# Patient Record
Sex: Male | Born: 1965 | Race: White | Hispanic: No | Smoking: Former smoker
Health system: Southern US, Community
[De-identification: ages and names within clinical notes are randomized; demographics above are authoritative.]

## PROBLEM LIST (undated history)

## (undated) DIAGNOSIS — K219 Gastro-esophageal reflux disease without esophagitis: Secondary | ICD-10-CM

## (undated) DIAGNOSIS — I10 Essential (primary) hypertension: Secondary | ICD-10-CM

## (undated) DIAGNOSIS — M519 Unspecified thoracic, thoracolumbar and lumbosacral intervertebral disc disorder: Secondary | ICD-10-CM

## (undated) DIAGNOSIS — G4733 Obstructive sleep apnea (adult) (pediatric): Secondary | ICD-10-CM

## (undated) DIAGNOSIS — D689 Coagulation defect, unspecified: Secondary | ICD-10-CM

## (undated) DIAGNOSIS — A4902 Methicillin resistant Staphylococcus aureus infection, unspecified site: Secondary | ICD-10-CM

## (undated) DIAGNOSIS — I2699 Other pulmonary embolism without acute cor pulmonale: Secondary | ICD-10-CM

## (undated) HISTORY — DX: Morbid (severe) obesity due to excess calories: E66.01

## (undated) HISTORY — DX: Unspecified thoracic, thoracolumbar and lumbosacral intervertebral disc disorder: M51.9

## (undated) HISTORY — DX: Obstructive sleep apnea (adult) (pediatric): G47.33

## (undated) HISTORY — DX: Gastro-esophageal reflux disease without esophagitis: K21.9

## (undated) HISTORY — DX: Coagulation defect, unspecified: D68.9

## (undated) HISTORY — DX: Other pulmonary embolism without acute cor pulmonale: I26.99

## (undated) HISTORY — DX: Methicillin resistant Staphylococcus aureus infection, unspecified site: A49.02

## (undated) HISTORY — DX: Essential (primary) hypertension: I10

---

## 2002-12-19 ENCOUNTER — Ambulatory Visit (HOSPITAL_COMMUNITY): Admission: RE | Admit: 2002-12-19 | Discharge: 2002-12-19 | Payer: Self-pay | Admitting: Gastroenterology

## 2003-10-21 DIAGNOSIS — A4902 Methicillin resistant Staphylococcus aureus infection, unspecified site: Secondary | ICD-10-CM

## 2003-10-21 HISTORY — DX: Methicillin resistant Staphylococcus aureus infection, unspecified site: A49.02

## 2004-09-01 ENCOUNTER — Inpatient Hospital Stay (HOSPITAL_COMMUNITY): Admission: EM | Admit: 2004-09-01 | Discharge: 2004-09-04 | Payer: Self-pay | Admitting: *Deleted

## 2004-10-06 ENCOUNTER — Ambulatory Visit (HOSPITAL_BASED_OUTPATIENT_CLINIC_OR_DEPARTMENT_OTHER): Admission: RE | Admit: 2004-10-06 | Discharge: 2004-10-06 | Payer: Self-pay | Admitting: Otolaryngology

## 2006-10-20 DIAGNOSIS — I2699 Other pulmonary embolism without acute cor pulmonale: Secondary | ICD-10-CM

## 2006-10-20 HISTORY — PX: VENA CAVA FILTER PLACEMENT: SUR1032

## 2006-10-20 HISTORY — DX: Other pulmonary embolism without acute cor pulmonale: I26.99

## 2007-03-01 ENCOUNTER — Inpatient Hospital Stay (HOSPITAL_COMMUNITY): Admission: AD | Admit: 2007-03-01 | Discharge: 2007-03-05 | Payer: Self-pay | Admitting: Internal Medicine

## 2007-03-01 ENCOUNTER — Encounter: Payer: Self-pay | Admitting: Emergency Medicine

## 2007-03-01 ENCOUNTER — Encounter: Payer: Self-pay | Admitting: Vascular Surgery

## 2007-03-10 ENCOUNTER — Inpatient Hospital Stay (HOSPITAL_COMMUNITY): Admission: EM | Admit: 2007-03-10 | Discharge: 2007-03-21 | Payer: Self-pay | Admitting: Emergency Medicine

## 2007-03-10 ENCOUNTER — Ambulatory Visit: Payer: Self-pay | Admitting: Vascular Surgery

## 2007-03-10 ENCOUNTER — Encounter (INDEPENDENT_AMBULATORY_CARE_PROVIDER_SITE_OTHER): Payer: Self-pay | Admitting: Internal Medicine

## 2007-03-12 ENCOUNTER — Ambulatory Visit: Payer: Self-pay | Admitting: Oncology

## 2008-06-27 IMAGING — CT CT ANGIO CHEST
3 of 4 series · 19 of 36 positions shown · IV contrast (APPLIED)
Comparison: none

CLINICAL DATA: Chest pain.  Reportedly diagnosed with DVT/PE last week and has been on heparin and Lovenox, is now on Coumadin.
CT ANGIOGRAPHY OF CHEST:
TECHNIQUE: Multidetector CT imaging of the chest was performed during bolus injection of intravenous contrast.  Multiplanar CT angiographic image reconstructions were generated to evaluate the vascular anatomy.
Contrast:  100 cc Omnipaque 350.

[Series 4: pulm embolism 2.0 b31f st · axial · 0.76mm/px · z∈[+994,+1256]mm · 13 of 153 slices shown]
[im 11/153  lung]
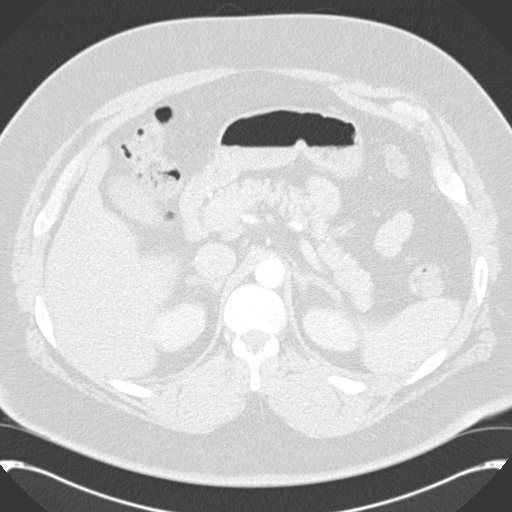
[im 22/153  mediastinal]
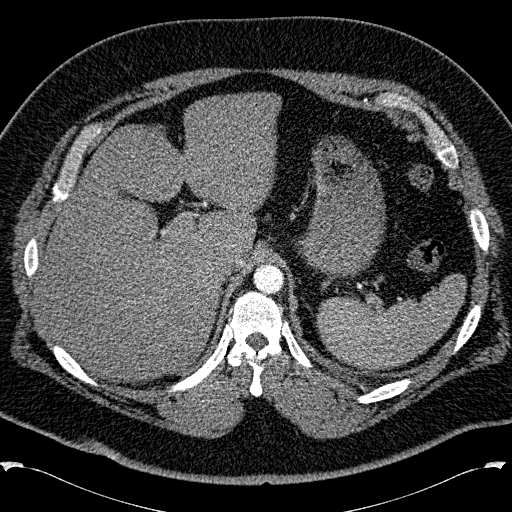
[im 33/153  lung]
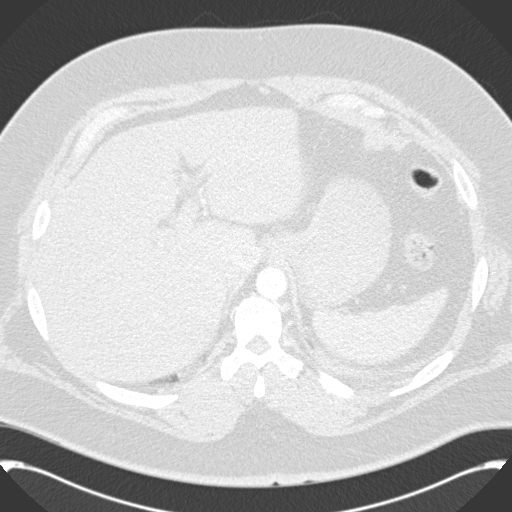
[im 44/153  mediastinal]
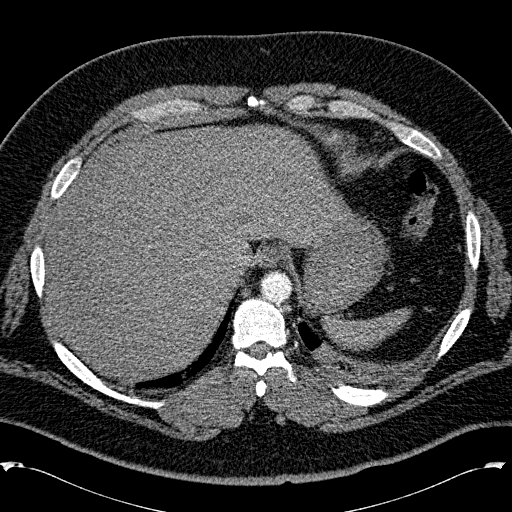
[im 55/153  lung]
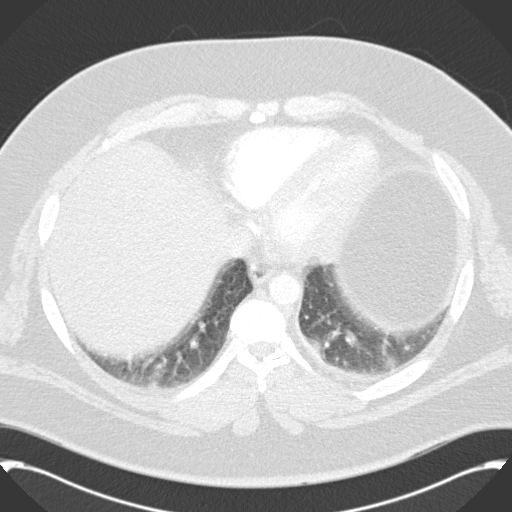
[im 66/153  mediastinal]
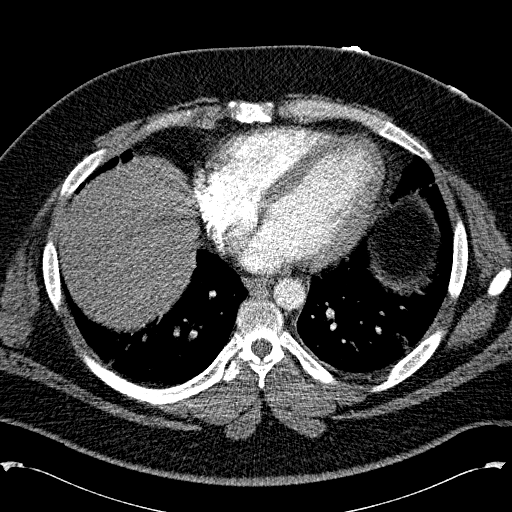
[im 77/153  lung]
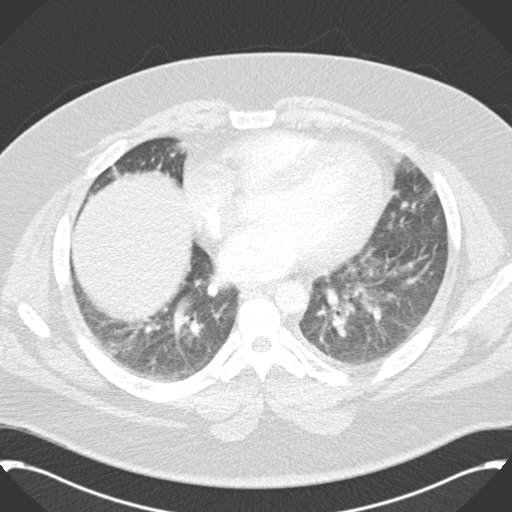
[im 87/153  mediastinal]
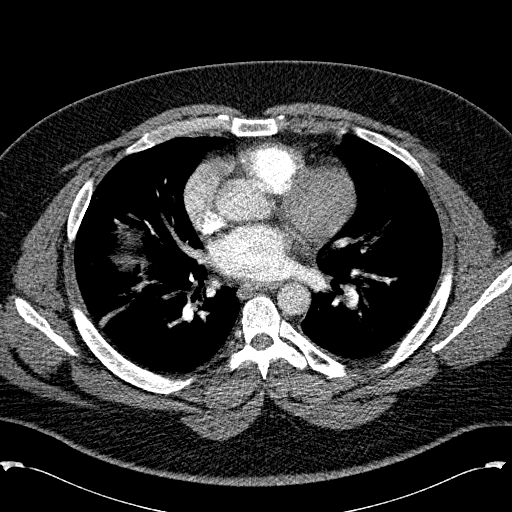
[im 98/153  lung]
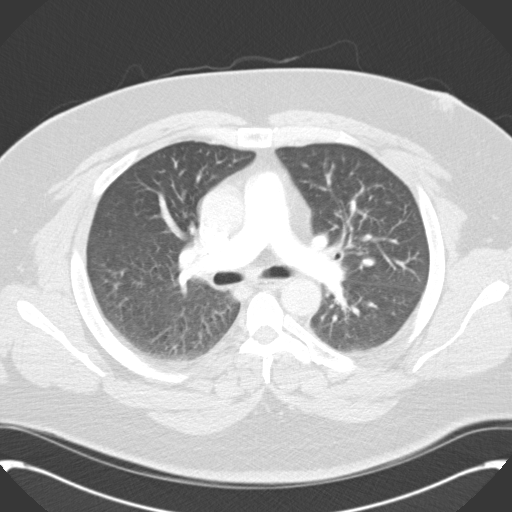
[im 109/153  mediastinal]
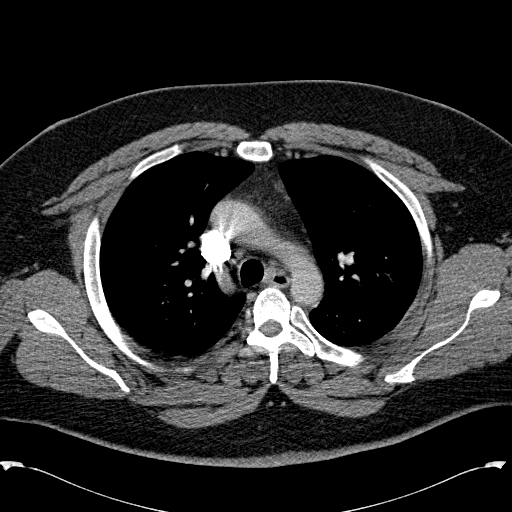
[im 120/153  lung]
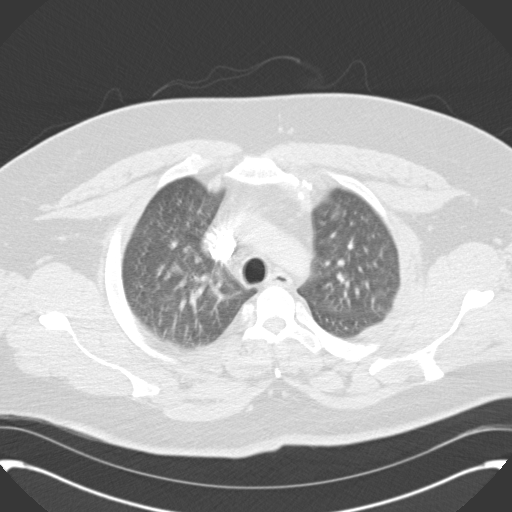
[im 131/153  mediastinal]
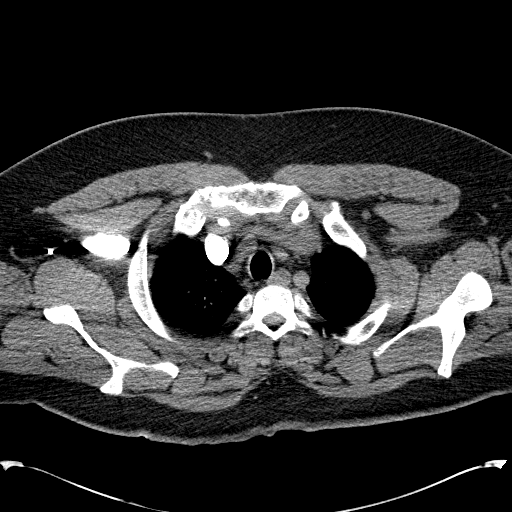
[im 142/153  lung]
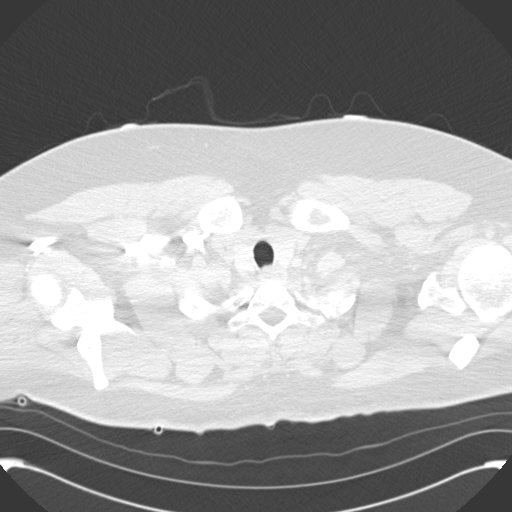

[Series 5: pulm embolism 2.0 b60f lung · axial · 0.76mm/px · z∈[+1024,+1094]mm · 3 of 139 slices shown]
[im 12/139  mediastinal]
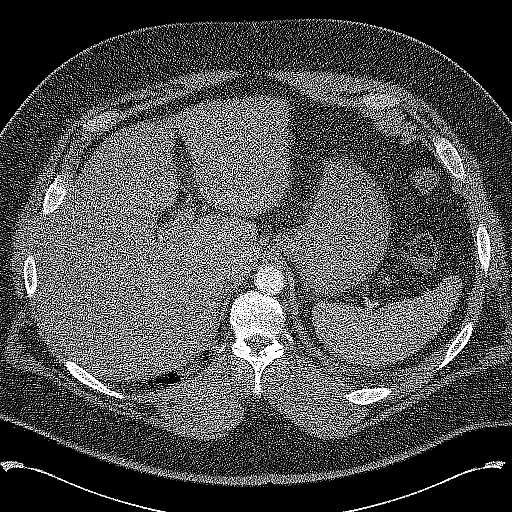
[im 35/139  mediastinal]
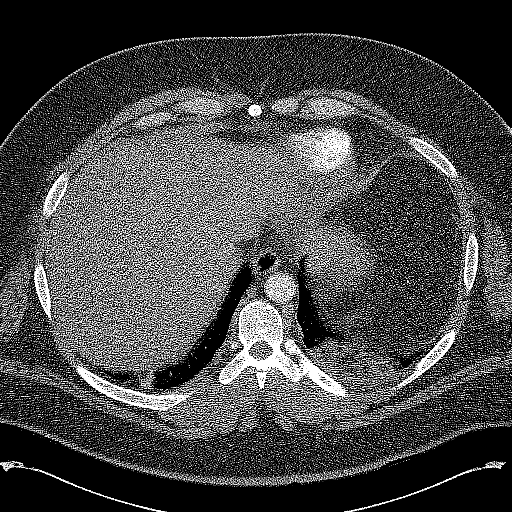
[im 47/139  mediastinal]
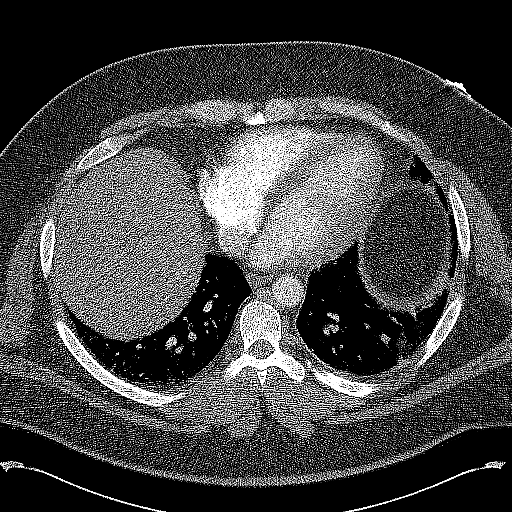

[Series 603: cor chest · coronal · 0.76mm/px · 3 of 116 slices shown]
[im 24/116  mediastinal]
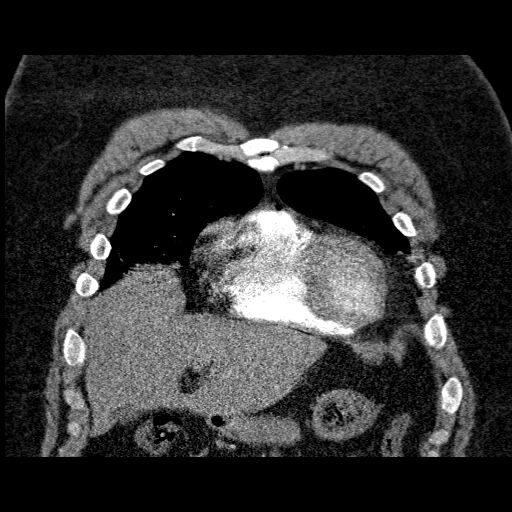
[im 47/116  mediastinal]
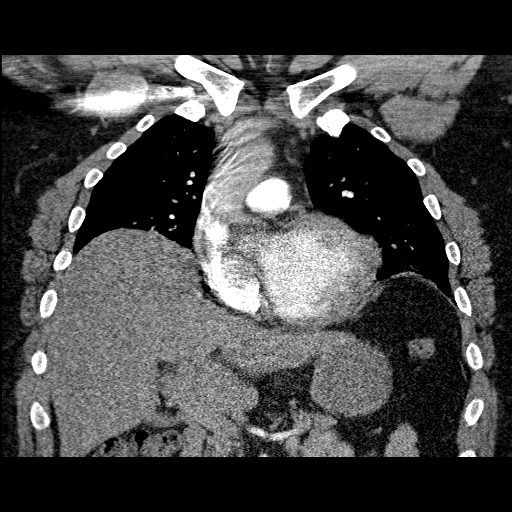
[im 70/116  mediastinal]
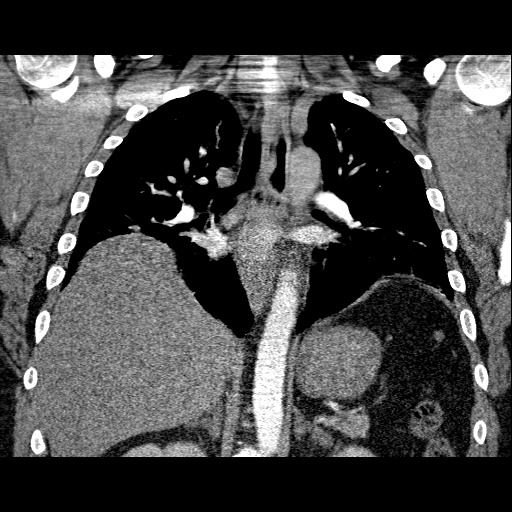

[19 of 36 positions shown; findings below may reference images not displayed]

FINDINGS: Again appreciated is the embolus involving the left lower lobar pulmonary artery branches and mural thrombus at the interlobar portion of the left pulmonary artery right where it bifurcates into left upper and lower lobar pulmonary arterial branches.  This site also appears unchanged.  Prior exam revealed embolus in the right lower lobe pulmonary artery branches.  There appears to be new embolus in the proximal descending right PA and its branches.  Subsegmental atelectasis at the lung bases.  Some improvement in posterior wedge-shaped consolidation in the RLL.  Overall there has been some improvement in aeration of the lungs.
IMPRESSION: Bilateral pulmonary emboli are again appreciated.  There appear to be some new emboli to the right lower lobe.  Overall, no significant change in bilateral pulmonary emboli when compared to 03/01/07.

## 2008-06-27 IMAGING — CR DG CHEST 2V
2 series · 2 of 2 positions shown · non-contrast
Comparison: 03/01/2007

CLINICAL DATA: Chest pain and shortness of breath.

CHEST - 2 VIEW

[w chest pa]
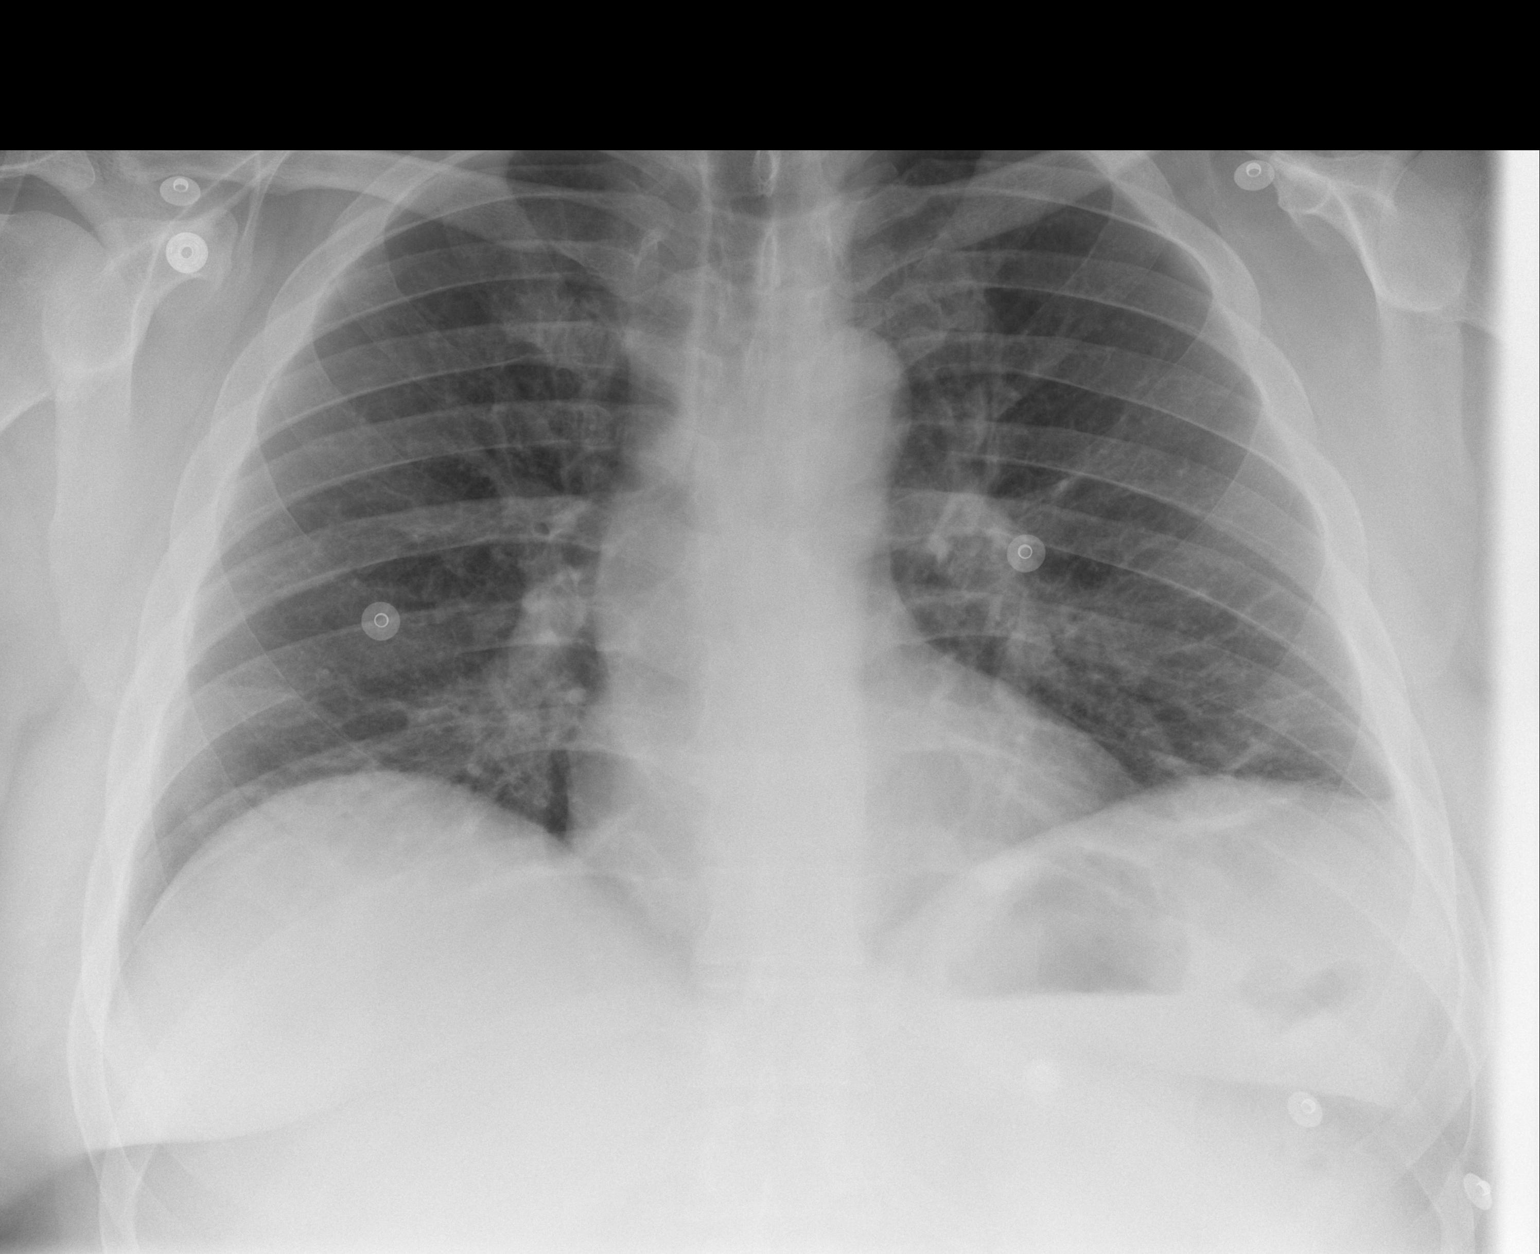

[w chest lat]
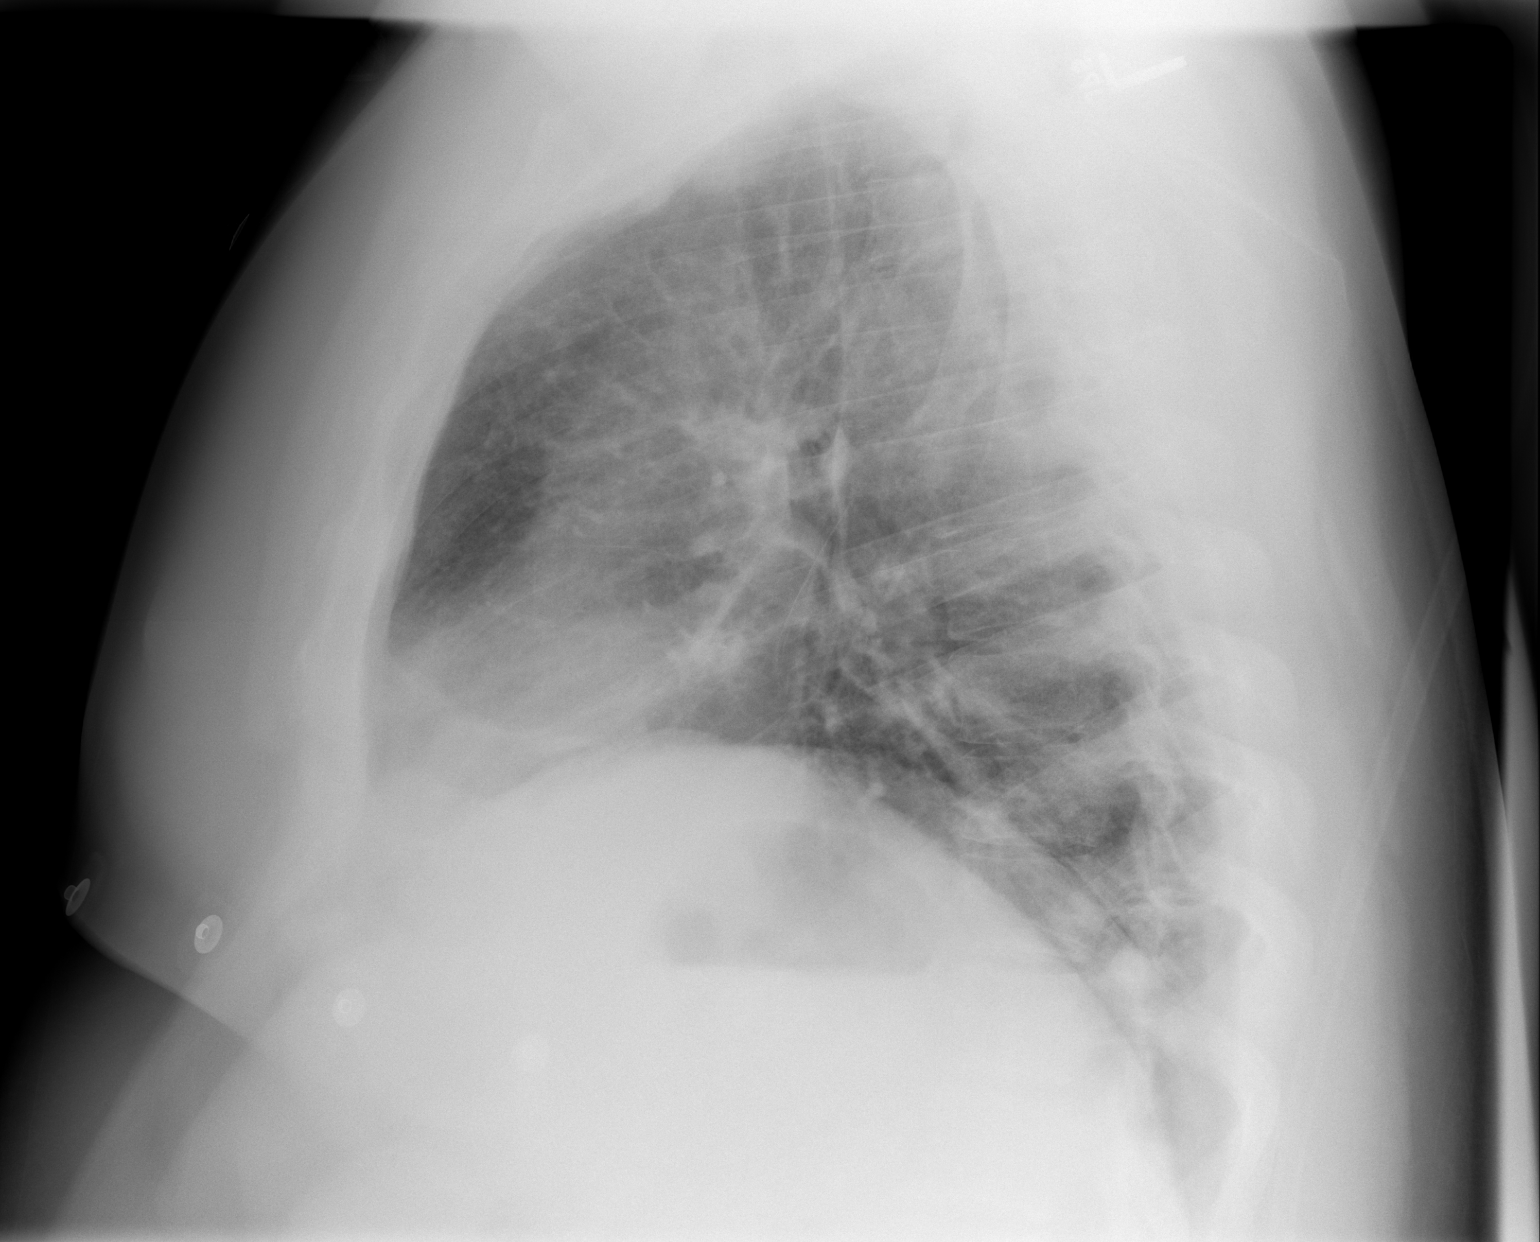

[2 of 2 positions shown; findings below may reference images not displayed]

FINDINGS: Bibasilar linear opacities are compatible with subsegmental
atelectasis or scar. Mildly low lung volumes are present. No definite pleural
effusion noted. Heart size remains within normal limits.

IMPRESSION

1. Stable appearance of bibasilar atelectasis or scarring. Please note that the
patient was recently diagnosed with acute pulmonary embolus on prior CT scan of
03/01/2007.

## 2011-03-04 NOTE — Consult Note (Signed)
[name], [name]              ACCOUNT NO.:  [id number]   MEDICAL RECORD NO.:  [id number]          PATIENT TYPE:  INP   LOCATION:  3731                         FACILITY:  [facility]   PHYSICIAN:  Lennis P. 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  He has  a positive family history of clots in his father who has prostate  cancer, and his mother who has intracardiac thrombus following an MI,  and a sister with a primary brain cancer and uterine cancer.  He has 4  other siblings who are healthy, no known coagulopathy in those.  The  patient had a persistent cramp in his right calf for several weeks in  mid April, then was admitted from May 12 to May 15 with bilateral  pulmonary emboli.  He was discharged on full dose b.i.d. Lovenox with  Coumadin continuing, the Coumadin not therapeutic at the time of  discharge on May 16.  He had INR checked by [facility]  on May 19, which the patient reports was 3.5, Lovenox discontinued then  and his Coumadin dose decreased to 10 mg daily from 12.5 mg.  He had  recurrent chest pain and shortness of breath and was readmitted on May  21 with findings of new pulmonary emboli, INR 3.1 at that point.  He has  had an IVC filter placed earlier today. He is no longer having any chest  pain or shortness of breath with minimal activity in the room prior to  IVC placement today.  Labs drawn May 12 before anticoagulation factor V  Leiden negative and a thrombin 374  (70 to 120), total protein C75 (70 to  140), Functional protein C145 (91 to 147), total protein S106 (70 to  140), functional protein S109 (81 to 180), and also lupus anticoagulant  drawn May 21, which was negative.  Repeat levels of the protein C and  protein S are low this admission, which is consistent with the Coumadin  use.   A CT of his chest, other than the pulmonary emboli, has had no  significant findings since CT abdomen and pelvis done Mar 11, 2007.  No  findings of concern.  Other labs from May 21, white count 9.1,  hemoglobin 15, platelets 327, sodium 135, potassium 4.3, chloride 101,  CO2 28, glucose 103, BUN 21, creatinine 1, alkaline phosphatase 52, GOT  21, GPT 60, total protein 7.8, albumin 3.5, calcium 9.3.   ALLERGIES:  NO KNOWN DRUG ALLERGIES.   PAST MEDICAL HISTORY:  1. He has hypertensive urgency, last admission.  2. GERD.  3. Sleep apnea.  4. Morbid obesity.  PRIMARY CARE PHYSICIAN:  [facility].   IMAGING:  Lower extremity Doppler's last admission and this admission  negative.   FAMILY HISTORY:  As above.   SOCIAL HISTORY:  Divorced, lives with his girlfriend.  Past cigarettes.  Recent cigars, which were discontinued on the day of the last admission  Mar 01, 2007.  With his work he has had recent plane travel and  frequently has 4+ hour drives.   PHYSICAL EXAMINATION:  GENERAL:  He is a pleasant gentleman, appears  comfortable, supine, on room air.  Girlfriend is present and very  supportive.  VITAL SIGNS:  Temperature 98, blood pressure 154/80, heart rate 89 and  regular, respirations 18 and not labored, 92% saturated on room air.  Weight on Mar 10, 2007, 153 kilograms.  NECK:  He has some bleeding on the dressing on his right neck from the  IVC filter placement.  LUNGS:  Have breath sounds heard bilaterally.  No wheezes.  HEART:  Regular rate and rhythm without gallop.  ABDOMEN:  Soft, obese, nontender, quite.  LOWER  EXTREMITIES:  No pitting edema, cords or tenderness, feet are warm  bilaterally.   IMPRESSION AND PLAN:  1. Recurrent pulmonary emboli now with IVC filter placed:  He does not      appear to have a protein C deficiency by labs drawn prior to any      anticoagulation.  There is no other clear etiology from these labs,      though certainly he has significant lifestyle risk factors.  In the      family members with DVTs, there is history of comorbid conditions      that may have caused the clots, so again I am not certain that this      is truly a family history.  I would be in favor of continuing      Coumadin at this point, keeping his INR from 2.5 to 3.5 with close      monitoring after discharge (by primary physician would be fine or      please let me know if this needs to be done at my office), as well      as eliminating lifestyle risk factors.  He has already stopped      smoking and I have encouraged him to continue with this.  He      clearly needs to increase activity, loose weight to ideal, and no      prolonged sitting.   Please call if my service can be of help from here.      Lennis P. Darrold Span, M.D.  Electronically Signed     LPL/MEDQ  D:  03/12/2007  T:  03/13/2007  Job:  [id number]

## 2011-03-04 NOTE — Discharge Summary (Signed)
[name], [name]              ACCOUNT NO.:  [id number]   MEDICAL RECORD NO.:  [id number]          PATIENT TYPE:  INP   LOCATION:  5024                         FACILITY:  [facility]   PHYSICIAN:  Wilson Singer, M.D.DATE OF BIRTH:  06-20-1966   DATE OF ADMISSION:  03/10/2007  DATE OF DISCHARGE:                               DISCHARGE SUMMARY   FINAL DISCHARGE DIAGNOSES:  1. Recurring pulmonary emboli.  2. Status post insertion of inferior vena cava Greenfield filter on      Mar 12, 2007, complicated by local bleeding, which has now stopped.  3. Morbid obesity.  4. Hypertension.   MEDICATIONS:  At present:  1. Protonix 40 mg daily.  2. Coumadin dependent on prothrombin time.  3. Hydrozine 10 mg t.i.d.  4. Prinivil 40 mg daily.  5. HCTZ 25 mg daily.  6. Lovenox 150 mg q.12 hourly per protocol.   CONDITION AT THE PRESENT:  Stable.   HISTORY:  This 45 year old man was admitted with left-side pleuritic  chest pain and a CT angio chest infractured new pulmonary emboli on the  right lower lobe.  In addition to the previous bilateral pulmonary  emboli for which he was admitted more recently a few weeks ago.  Please  see initial history and physical examination done by myself.   HOSPITAL PROGRESS:  He was admitted and anticoagulated with Lovenox and  Coumadin.  I discussed his case with the interventional radiologist to  agree that he needed a Greenfield filter and an IVC filter was therefore  placed on Mar 12, 2007.  The procedure apparently went without  complications, but then following the procedure he continued to have  local bleeding at the site of the right internal jugular stick.  The  bleeding continued for over 24 hours and was somewhat difficult to stop  by even applying local pressure and sitting up the patient in bed.  Eventually he did require fresh frozen plasma and a total of 10 mg of  vitamin K intravenously.  He did not require any blood transfusions.  The  bleeding did stop 2 days ago.  He had minimal bleeding yesterday and  today there has not been any bleeding.  He feels well.  There is no  shortness of breath.  He is now being anticoagulated once again with  Lovenox and Coumadin  per the pharmacy.   PHYSICAL EXAMINATION:  VITAL SIGNS:  Today temperature 98.3, blood  pressure 115/66, pulse 87, saturation 97% on room air.  GENERAL:  There are no new physical findings.  LUNGS:  His lung fields are clear.   INVESTIGATIONS:  Today shows an INR of 1.1.   FURTHER DISPOSITION:  Clearly because of the vitamin K it was necessary  to reverse the effects of Coumadin while he was bleeding status post IVC  filter.  It will take longer to anti-coagulate him fully again and he  understands  this.  In the meantime he is on Lovenox subcutaneously also for  anticoagulation.  Dr. Darrold Span, the hematologist, also has seen the  patient and agrees with management and she feels the goal  INR should be  2.5 to 3.5, based on his history of recurrent pulmonary emboli.      Wilson Singer, M.D.  Electronically Signed     NCG/MEDQ  D:  03/16/2007  T:  03/16/2007  Job:  [id number]   cc:   [facility]

## 2011-03-04 NOTE — H&P (Signed)
NAMEMARRION, [name]              ACCOUNT NO.:  [id number]   MEDICAL RECORD NO.:  [id number]          PATIENT TYPE:  INP   LOCATION:  3731                         FACILITY:  [facility]   PHYSICIAN:  Wilson Singer, M.D.DATE OF BIRTH:  11/26/1965   DATE OF ADMISSION:  03/10/2007  DATE OF DISCHARGE:                              HISTORY & PHYSICAL   HISTORY:  This is a 45 year old man who was just discharged  approximately a week ago, having been admitted with bilateral pulmonary  emboli. He was admitted, anticoagulated and did well and was  transitioned from Lovenox onto Coumadin, which he started about 3 days  ago.  He had been doing well, but in the last 24 hours, he began to get  left pleuritic chest pain and represented himself to the emergency room  where a concern about further pulmonary emboli.  When he was seen in the  emergency room, a CT angio of chest scan was done and interestingly,  this showed new pulmonary emboli in the left lower lobe, in addition to  the previous bilateral pulmonary emboli already seen.  He has a  background history of hypertension and obesity.   SOCIAL HISTORY:  He is single, but lives with his girlfriend.  He smokes  about 3 cigars per day.  He occasionally drinks alcohol.  He does not  have consistent employment at the present time.   PAST SURGICAL HISTORY:  Adenoidectomy in his first grade.   PAST MEDICAL HISTORY:  1. Hypertension.  2. Bilateral pulmonary emboli, as mentioned above.  3. History of sleep apnea.  4. Morbid obesity.   MEDICATIONS:  1. Coumadin 10 mg daily.  2. Hydralazine 10 mg t.i.d.  3. Lisinopril 40 mg daily.  4. HCTZ 25 mg daily.  5. Ibuprofen p.r.n.  6. Zantac p.r.n.   ALLERGIES:  NONE.   FAMILY HISTORY:  Noncontributory.   REVIEW OF SYSTEMS:  He has no other symptoms referable to all systems  examined and in particular does not have hemoptysis, shortness of  breath, dizziness or syncope.   PHYSICAL EXAMINATION:   GENERAL:  He is morbidly obese.  VITAL SIGNS:  Temperature 97.0.  Blood pressure 128/83.  Pulse 82.  Respiratory rate 12.  Saturation 96% on room air.  CARDIOVASCULAR EXAMINATION:  Heart sounds are present and normal.  There  are no murmurs.  LUNGS:  Fields are clear, except for a few crackles at both bases.  There is no pleuritic rub.  ABDOMEN:  Soft and nontender.  There is no hepatosplenomegaly.  There  are no masses felt in the pelvic area.  NEUROLOGICAL:  He is alert and oriented with no focal neurological  signs.   INVESTIGATIONS:  Hemoglobin 16.7, sodium 137, potassium 3.8, chloride  107, BUN 23, creatinine 1.1.  As mentioned above, CT angio chest shows  new right lower lobe pulmonary emboli.   IMPRESSION:  1. Recurrent pulmonary embolism.  2. Hypertension.  3. Obesity.  4. Sleep apnea.   PLAN:  1. Admit.  2. Lovenox and Coumadin.  3. CT abdomen and pelvis.  4. Bilateral venous Dopplers.  Further recommendations will depend on patient's hospital progress.      Wilson Singer, M.D.  Electronically Signed     NCG/MEDQ  D:  03/10/2007  T:  03/10/2007  Job:  [id number]   cc:   [facility]

## 2011-03-04 NOTE — Discharge Summary (Signed)
[name], [name]:  [id number]   MEDICAL RECORD NO.:  [id number]          PATIENT TYPE:  INP   LOCATION:  5024                         FACILITY:  [facility]   PHYSICIAN:  Marcellus Scott, MD     DATE OF BIRTH:  06/26/1966   DATE OF ADMISSION:  03/10/2007  DATE OF DISCHARGE:  03/21/2007                               DISCHARGE SUMMARY   ADDENDUM:   PRIMARY CARE PHYSICIAN:  Ursula Beath, M.D., [facility].   This is an addendum to the discharge summary that was done by Dr.  Karilyn Cota on Mar 16, 2007.   DISCHARGE DIAGNOSES:  1. Recurrent bilateral pulmonary embolism status post Greenfield      inferior vena cava filter.  2. Morbid obesity.  3. Obstructive sleep apnea syndrome.  4. Hypertension.  5. Gastroesophageal reflux disease.  6. Tobacco abuse.   DISCHARGE MEDICATIONS:  1. Coumadin 15 mg p.o. q.h.s.  The dose is to be adjusted based on      daily INR checks by primary medical doctor until INR is therapeutic      and steady between 2.5 and 3.5.  2. Lovenox 150 mg subcutaneously q.12h.  To discontinue after 48 hours      after INR therapeutic greater than or equal to 2.5.  3. Protonix 40 mg p.o. daily.  4. Hydralazine 10 mg p.o. t.i.d.  5. Lisinopril 40 mg p.o. daily.  6. Hydrochlorothiazide 25 mg p.o. daily.  7. Tylenol 650 mg p.o. q.4-6h. p.r.n.   PROCEDURES SINCE Mar 16, 2007:  None.   LABORATORY DATA:  INR 2.2 on March 21, 2007.  CBC with hemoglobin of 14,  hematocrit 40.1, white blood cells 10.2, platelets 338.  Lipid panel  with cholesterol of 156, triglycerides 149, HDL 24, LDL 102, VLDL 30.   CONSULTATIONS:  1. Hematology from Lennis P. Darrold Span, M.D.  2. Interventional radiology from Arn Medal, M.D.  3. Pharmacy.   HOSPITAL COURSE SINCE Mar 16, 2007:  For details of the initial part of  admission, please refer to the history and physical done by Dr. Lovell Sheehan  and the interim discharge summary that was done by Dr.  Karilyn Cota.  Since  Mar 17, 2007, the patient initially complained of some left lower  (infraaxillary) pleuritic chest pain which has subsequently gradually  resolved with analgesics, and he is currently asymptomatic of any chest  pain or dyspnea.  The patient's anticoagulation had to be reversed with  vitamin K and fresh frozen plasma secondary to bleeding from the IVC  filter insertion site on the right side of the neck.  Once the bleeding  had stopped, the patient was resumed on his Lovenox and Coumadin.  He  has required larger doses of Coumadin and Lovenox probably because of  the vitamin K that was used.  His current INR values are as indicated  above.  The patient is stable and will be discharged on above doses of  Coumadin and Lovenox.  The patient is to be closely monitored with daily  INR checks until the INR is therapeutic and steady between 2.5 and  3.5.  Hematology was consulted.  The etiology of his pulmonary embolism was  unclear.  They indicated that he did not have a protein C deficiency by  labs drawn prior to anticoagulation, and there was no other clear  etiology from the hypercoagulable panel, though certainly he had  lifestyle risk factors.  They were in favor of keeping his INR between  2.5 and 3.5.  They also suggested eliminating his lifestyle risk factors  of smoking, obesity, and avoid prolonged sitting.  Tobacco cessation  counseling was done in the hospital.  The patient says that he has  obstructive sleep apnea which was diagnosed by a sleep study 2 years  ago, and tonsillectomy was planned and subsequently deferred.  He has  never used CPAP, and that should be considered as an outpatient to  decrease the comorbidities associated with it.      Marcellus Scott, MD  Electronically Signed     AH/MEDQ  D:  03/21/2007  T:  03/21/2007  Job:  [id number]   cc:   Ursula Beath, MD  Lennis P. Darrold Span, M.D.  Arn Medal, MD

## 2011-03-04 NOTE — Discharge Summary (Signed)
NAMENASIRE, REALI NO.:  [id number]   MEDICAL RECORD NO.:  [id number]          PATIENT TYPE:  INP   LOCATION:  3737                         FACILITY:  [facility]   PHYSICIAN:  Altha Harm, MDDATE OF BIRTH:  06/25/1966   DATE OF ADMISSION:  03/01/2007  DATE OF DISCHARGE:  03/05/2007                               DISCHARGE SUMMARY   DISCHARGE DISPOSITION:  Home.   FINAL DISCHARGE DIAGNOSES:  1. Bilateral pulmonary embolus.  2. Hypertensive urgency, now controlled.  3. Gastroesophageal reflux disease.  4. Morbid obesity.  5. Possible obstructive sleep apnea.  6. Allergic rhinitis.  7. Leukocytosis.   DISCHARGE MEDICATIONS:  1. Lovenox 150 mg subcutaneous every 12 hours.  2. Coumadin 12.5 mg p.o. q.h.s.  3. Lisinopril 40 mg p.o. daily.  4. Hydrochlorothiazide 25 mg p.o. daily.  5. Hydralazine 10 mg p.o. t.i.d.  6. Prilosec OTC 20 mg p.o. daily.   CONSULTANTS:  None.   PROCEDURES:  None.   DIAGNOSTIC STUDIES:  1. Complete Doppler of bilateral lower extremities show no evidence of      DVT.  2. CT angiogram of the chest, which showed acute pulmonary emboli in      both lower lobes of the pulmonary arteries.  3. Chest x-ray, 2 views, which shows mild plate like atelectasis      versus crowding of the right lung base.   PERTINENT LABORATORY STUDIES:  INR, today, is 1.8.  White blood cell  count 13.4, hemoglobin 14.7, hematocrit 43.0, platelet count 3000.   CHIEF COMPLAINT:  Shortness of breath x2 days.   HPI:  Please see the H&P dictated by Dr. Brien Few for details of the HPI.   HOSPITAL COURSE:  1. Bilateral pulmonary emboli.  The patient was started on      fractionated heparin, along with Coumadin for 48 hours.  After      that, the patient was transitioned over to Lovenox and Coumadin.      The patient had been on 12.5 mg of Coumadin; however, his INR has      been subtherapeutic and recommendations are for him to continue on      Lovenox and  Coumadin until the INR reaches a therapeutic level.      The therapeutic target is 2.5-3.5 in the INR.  2. Hypertensive urgency, now controlled.  The patient was admitted and      had markedly elevated blood pressures.  The patient had a prior      history of hypertension, but had been off his medicines for      approximately 2 years due to financial difficulties.  The patient      will be restarted on Zestril and hydrochlorothiazide and presently      is on Zestril 40 mg daily, hydrochlorothiazide 25 mg daily and      hydralazine 10 mg p.o. t.i.d.  Blood pressures are well controlled      at this time with today's blood pressure being 121/81 with a heart      rate of 69.  3. Morbid obesity.  Patient was counseled on  appropriate diet.  4. Possible obstructive sleep apnea.  The patient states that he had      been evaluated in the past for obstructive sleep apnea with      recommendations from an ENT for an uvuloplasty and possible      adenoidectomy and tonsillectomy to correct the obstructive sleep      apnea.  It is a well known fact that these surgeries are not      necessarily correctable for obstructive sleep apnea and that the      patient really should be on CPAP to minimize his medical risk      associated with obstructive sleep apnea.  My recommendation is that      the patient receive a repeat polysomnogram as an outpatient and      then options for treatment based upon the results of the      polysomnogram.   The patient's condition on discharge is stable.   DIETARY RESTRICTIONS:  The patient should be on a low-potassium diet.   PHYSICAL RESTRICTIONS:  None.   FOLLOWUP:  The patient will have his INR and PT checked on Monday with  the doctors at [facility], of which, he has an  appointment.      Altha Harm, MD  Electronically Signed     MAM/MEDQ  D:  03/05/2007  T:  03/06/2007  Job:  [id number]

## 2011-03-04 NOTE — H&P (Signed)
[name], [name]:  [id number]   MEDICAL RECORD NO.:  [id number]          PATIENT TYPE:  EMS   LOCATION:  ED                           FACILITY:  [facility]   PHYSICIAN:  Isidor Holts, M.D.  DATE OF BIRTH:  08-01-1966   DATE OF ADMISSION:  03/01/2007  DATE OF DISCHARGE:                              HISTORY & PHYSICAL   PMD:  Previously Dr. Julian Reil. [employer].  The patient is now  unassigned.   CHIEF COMPLAINT:  Chest pain times 5 days; shortness of breath x2 days.   HISTORY OF PRESENT ILLNESS:  This is a 45 year old male.  History is  obtained from the patient, who is quite a good historian.  For past  medical history, see below.  According to the patient, he was quite well  until 02/26/2007 when he started feeling soreness in the right side of  his chest, which appeared to radiate to the back.  This increased  progressively, and by 02/27/2007 had affected the left side so much so,  that he experienced difficulty taking deep breaths.  He came acutely  short of breath on 02/27/2007, felt much worse on 02/28/2007, and came  to the emergency department.  Over the past 2 days he had been somewhat  fatigued and somnolent.  On detailed questioning, it appears that the  patient travels a lot, driving all over [place].  As a  matter  of fact his last travel was on 02/23/2007.  Prior to that he had a bad  cramp in the right calf for approximately 1 week.  Denies hemoptysis,  denies fever.   PAST MEDICAL HISTORY:  1. Facial cellulitis 2005.  2. Morbid obesity.  3. Hypertension.  4. Obstructive sleep apnea syndrome, moderately severe, confirmed by      sleep study done on 10/06/2004.  The patient is not currently on      CPAP.  5. GERD.  6. Seasonal allergies.   MEDICATION HISTORY.:  1. Zantac OTC p.r.n.  2. Ibuprofen OTC p.r.n.   ALLERGIES:  NO KNOWN DRUG ALLERGIES.   SYSTEMS REVIEW:  As per HPI and chief complaint otherwise negative.   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  He was divorced in 2006, is a smoker, smokes approximately two to four  cigars per day.  Has been smoking since the age of 17 years, initially  cigarettes, but quit smoking cigarettes in 1997 and a couple of years  later started smoking cigars.  Drinks beer very occasionally, has no  history of drug abuse.  The patient has no offspring.   FAMILY HISTORY:  The patient has three brothers, two sisters.  One of  his sisters has uterine cancer, the rest are alive and well.  His  parents are still living.  Mother is age 55 years and has a history of  coronary artery disease status post MI, status post CABG, and currently  has an intracardiac thrombus.  Father is age 25.  He has a history of  prostate cancer and venous thromboembolic disease.   PHYSICAL EXAMINATION:  VITALS:  Temperature 100.2, pulse 91 per minute,  regular, respiratory rate 20, BP 152/87 mmHg, pulse oximetry 95% on room  air.  The patient does not appear to be in obvious acute discomfort  although he experiences difficulty taking deep breaths secondary to  pain.  Alert, communicative.  HEENT: No clinical pallor or jaundice.  No conjunctival injection.  Throat is clear.  NECK:  Supple.  JVP not seen secondary to fat neck.  CHEST:  Clinically clear to auscultation.  No wheezes or crackles.  CARDIAC:  Heart sounds 1 and 2 heard, normal, regular, no murmurs.  ABDOMEN:  Morbidly obese, unable to palpate organs however nontender.  Normal bowel sounds.  LOWER EXTREMITIES:  No pitting edema.  Palpable peripheral pulses.  No  difference in calf circumference is obvious.  MUSCULOSKELETAL:  Unremarkable.  CENTRAL NERVOUS SYSTEM: No focal neurologic deficit on gross  examination.   LABORATORY DATA:  CBC:  WBC 13.7, hemoglobin 15.1, hematocrit 44.1,  platelets 237.  Electrolytes:  sodium  139, potassium 3.9, chloride 110,  CO2 23, BUN 15, creatinine 0.87, glucose 111, lipase 13, amylase 44.  LFTs normal.  Troponin I at point of care less than 0.05, D-dimer is  elevated at 1.16.  CHEST X-RAY:  Dated 03/01/2007, shows suboptimal inspiration/mild plate-  like atelectasis or scar right lower lobe. Chest CT angiogram dated  03/01/2007,  shows acute pulmonary emboli lower lobe pulmonary arteries  also bilateral lower lobe atelectasis/consolidation consistent with lung  infarcts.   ASSESSMENT AND PLAN:  1. Bilateral pulmonary emboli.  Predispositions, include morbid      obesity and frequent motor vehicle travel.  Patient is young      however, and therefore although he does not appear to have a      positive family history of VTE at a young age, we will do      thrombophilic screen.  We shall also do bilateral lower extremity      venous Doppler to rule out DVT.  Meanwhile we will admit to step-      down unit, as the patient is at risk of respiratory failure given      his history of obstructive sleep apnea syndrome, continued smoking      and now superadded pulmonary embolism.  Will anticoagulate and      commence analgesics.   1. History of moderately severe obstructive sleep apnea syndrome.  The      patient is not on home BiPAP.  We shall monitor closely, treat with      bronchodilator nebulizers and oxygen supplementation, will utilize      p.r.n. BiPAP if indicated.   1. Hypertension.  This is uncontrolled.  Will start ACE inhibitor      treatment, monitor and adjust medications as indicated.   1. History of GERD.  Will utilize proton pump inhibitor.   1. Seasonal allergies.  This appears quiescent at present.   1. Smoking history.  We shall counsel appropriately.  However, the      patient has declined Nicoderm CQ patch.   Further management will depend on clinical course.      Isidor Holts, M.D.  Electronically Signed    CO/MEDQ  D:  03/01/2007  T:   03/01/2007  Job:  [id number]

## 2011-03-07 NOTE — Op Note (Signed)
   NAME:  [name], [name]                        ACCOUNT NO.:  [id number]   MEDICAL RECORD NO.:  [id number]                   PATIENT TYPE:  AMB   LOCATION:  ENDO                                 FACILITY:  [facility]   PHYSICIAN:  James L. Malon Kindle., M.D.          DATE OF BIRTH:  11-21-1965   DATE OF PROCEDURE:  12/19/2002  DATE OF DISCHARGE:                                 OPERATIVE REPORT   PROCEDURE:  Colonoscopy.   MEDICATIONS:  Fentanyl 62.5 mcg, __________  6 mg.   INDICATIONS:  Heme-positive stool.   ENDOSCOPE:  Adult Olympus scope.   DESCRIPTION OF PROCEDURE:  The procedure had been explained to the patient  and consent obtained.  With the patient in the left lateral decubitus  position, the Olympus adult scope was inserted and advanced.  The prep was  quite good.  We were able to advance over to the cecum using abdominal  pressure and position changes.  The ileocecal valve was seen, and the tip of  the cecum was seen in the distance.  The scope was withdrawn, and the cecum,  ascending colon, transverse colon, descending, and sigmoid colon were seen  well upon withdrawal.  No polyps or other lesions were seen.  No significant  diverticular disease.  The scope was withdrawn in the rectum, and the rectum  was free of polyps.  There were moderate-sized internal hemorrhoids.  The  scope was withdrawn.  He tolerated the procedure well and was maintained on  low-flow oxygen and pulse oximetry throughout the procedure with no obvious  problems.   ASSESSMENT:  1. Positive stool, possibly due to internal hemorrhoids.  2. Internal hemorrhoids.   PLAN:  Will give a hemorrhoid instruction sheet and see back in the office  in three to four months to recheck his stools.                                               James L. Malon Kindle., M.D.    [name]  D:  12/19/2002  T:  12/19/2002  Job:  [id number]   cc:   Duncan Dull, M.D.  [address]  [place]  [place] [address]  Fax: [phone]

## 2011-03-07 NOTE — H&P (Signed)
[name], [name]:  [id number]   MEDICAL RECORD NO.:  [id number]          PATIENT TYPE:  EMS   LOCATION:  ED                           FACILITY:  [facility]   PHYSICIAN:  Jackie Plum, M.D.DATE OF BIRTH:  1966-01-20   DATE OF ADMISSION:  09/01/2004  DATE OF DISCHARGE:                                HISTORY & PHYSICAL   REASON FOR ADMISSION:  1.  Facial cellulitis.  2.  Obesity.  3.  Hypertension.   CHIEF COMPLAINT:  Painful swollen face for 3 days.   HISTORY OF PRESENT ILLNESS:  The patient presents with right sided painful  swollen and red in the face over the last 3 days. According to the patient,  he was in his usual health until about 4 days ago when he had a pimple on  the nose, around the bridge area. He tried to squeeze it and the following  day, he noted swelling of his face. This got progressively worse with pain,  swelling and redness, which prompted him to come to the emergency department  for evaluation. In the emergency room, the patient was seen by the emergency  department physician. He obtained a CBC and B-met as well as CT scan of the  face. CT scan of the face showed cellulitis of the right nose area with  early abscess without any evidence of sinus disease or bony destruction. He  hospitalist services was asked to evaluate for admission. The patient denies  any nausea or vomiting but admits to fever and chills. He denies any history  of headaches, visual changes, dizziness, abdominal pain, shortness of  breath, cough, sputum production, dysuria, frequency or micturition. He also  says that since this morning, he had noted that a very bright light borders  the eye.   PAST MEDICAL HISTORY:  Positive for history of hypertension and obesity. The  patient denies any history of diabetes mellitus or heart disease.   MEDICATIONS:  The patient is on HCTZ and Cozaar, dose unclear.   FAMILY HISTORY:  Negative for diabetes.   SOCIAL  HISTORY:  The patient lives with his fiancee. He does not use illicit  drugs. He smokes cigars on a daily basis. Drinks alcohol on a social basis.   REVIEW OF SYSTEMS:  Significant positives and negatives listed in the HPI,  otherwise unremarkable.   PHYSICAL EXAMINATION:  VITAL SIGNS:  Blood pressure 165/102, temperature  98.7 degrees Fahrenheit. Pulse 87, respiratory rate 20. Sating 98% on room  air.  GENERAL:  He was not toxic looking.  HEENT:  He had redness and swelling involving the tip of the nose and the  bridge of the nose and an area measured about 3 cm x 4 cm as well as right  infraorbital area in area measuring about 4 cm x 4 cm. His visual acuities  were intact. There was no conjunctival pallor. Areas of tenderness and  redness, as stated above, were warm to touch and painful to touch.  LUNGS:  Clear to auscultation.  NECK:  Supple. No jugular venous distention.  CARDIAC:  Regular rate and rhythm.  No gallops or murmur.  ABDOMEN:  Was obese, soft, nontender.  EXTREMITIES:  Negative for edema.  NEUROLOGIC:  Alert and oriented x3.  No focal deficits.   LABORATORY DATA:  CT scan of the face, as noted above.  CBC count 12,  hemoglobin 16, hematocrit 45.5, MCV 88.1, platelet count 188,000. Sodium  138, potassium 3.7, chloride 108, CO2 25, glucose 95, BUN 13, creatinine  0.9, calcium 8.7.   IMPRESSION:  Orbital cellulitis.   PLAN:  The patient will be admitted to a regular bed. Will initiate  intravenous antibiotic Cefazolin and other support measures including  analgesics. He is at risk for cavernous sinus thrombosis and possibly  orbital cellulitis and will be monitored for such. We have consulted oral  maxillofacial surgery with Dr. Bradly Chris, who will be seeing the patient today.     Geor   GO/MEDQ  D:  09/01/2004  T:  09/01/2004  Job:  [id number]   cc:   Duncan Dull, M.D.  [address]  [place]  [place] [address]  Fax: [phone]

## 2011-03-07 NOTE — Procedures (Signed)
NAME:  [name], [name]:  [id number]   MEDICAL RECORD NO.:  [id number]          PATIENT TYPE:  OUT   LOCATION:  SLEEP CENTER                 FACILITY:  [facility]   PHYSICIAN:  Clinton D. Maple Hudson, M.D. DATE OF BIRTH:  December 03, 1965   DATE OF STUDY:  10/06/2004                              NOCTURNAL POLYSOMNOGRAM   STUDY DATE:  October 06, 2004   REFERRING PHYSICIAN:  Hermelinda Medicus, M.D.   INDICATION FOR STUDY:  Hypersomnia with sleep apnea.   EPWORTH SLEEPINESS SCORE:  15/24   BODY MASS INDEX:  47.6   WEIGHT:  315 pounds   NECK SIZE:  19 inches   SLEEP ARCHITECTURE:  Total sleep time 398 minutes with sleep efficiency 96%.  Stage I was 6%, stage II 76%, stages III and IV were absent, REM was 18% of  total sleep time.  Sleep latency 5 minutes, REM latency 97 minutes, awake  after sleep onset 11 minutes, arousal index 26.   RESPIRATORY DATA:  NPSG protocol.  RDI 44.5/hr indicating moderately severe  obstructive sleep apnea/hypopnea syndrome and reflecting 3 central apneas,  48 obstructive apneas and 244 hypopneas.  Events were not positional.  Almost all sleep was on the right side or supine.  REM RDI was 84.2.   OXYGEN DATA:  Moderate snoring with oxygen desaturation to a nadir of 78%  with respiratory events.  Mean oxygen saturation through the study was 92-  93% on room air.   CARDIAC DATA:  Normal sinus rhythm.   MOVEMENT/PARASOMNIA:  Occasional leg jerks with insignificant effect on  sleep.   IMPRESSION/RECOMMENDATION:  Moderately severe obstructive sleep  apnea/hypopnea syndrome, respiratory disturbance index 44.5 obstructive  events per hour with oxygen desaturation during events to 78%.  The patient  can return for continuous positive airway pressure titration if appropriate,  otherwise evaluate for alternative therapies.                                                           Clinton D. Maple Hudson, M.D.  Diplomate, American Board   CDY/MEDQ  D:   10/13/2004 12:43:50  T:  10/13/2004 21:21:17  Job:  [id number]

## 2011-03-07 NOTE — Discharge Summary (Signed)
[name], [name] NO.:  [id number]   MEDICAL RECORD NO.:  [id number]          PATIENT TYPE:  INP   LOCATION:  0483                         FACILITY:  [facility]   PHYSICIAN:  Sherin Quarry, MD      DATE OF BIRTH:  January 21, 1966   DATE OF ADMISSION:  09/01/2004  DATE OF DISCHARGE:  09/04/2004                                 DISCHARGE SUMMARY   HISTORY OF PRESENT ILLNESS:  [name] is a 45 year old man who  initially presented on 11/13 with swelling of the right side of his face and  nose.  According to the patient he was in his usual state of health until  four days prior to admission when he developed a pimple on his nose.  He  tried to squeeze the pimple to extract pus, but this was not very  successful.  The next day he noted increased swelling of his nose and face  associated with increasing pain and redness.  As a result, he came to the  emergency room on 11/13.  A CT scan of the face showed the cellulitis  changes of the nose without any signs of sinus disease.  Dr. Julio Sicks was  asked to admit the patient at that time.   ADMISSION PHYSICAL EXAMINATION:  VITAL SIGNS:  Blood pressure 160/102,  temperature 98.7, pulse 87, respirations 20.  HEENT:  The patient was noted to have redness and swelling of the tip and  bridge of the nose, in an area measured about 3-4 cm.  Also there was  swelling and redness of the right infraorbital area.  Visual acuity was  good.  There were full extraocular movements.  The area under the eye was  described as warm and painful to the touch.  CHEST:  Clear.  CARDIOVASCULAR:  Normal S1, S2, without rubs, murmurs or gallops.  REMAINDER:  Within normal limits.   LABORATORY DATA:  White count of 12,000, hemoglobin 16.  Sodium was 138,  potassium 3.7, chloride 108, CO2 25, creatinine 0.9, BUN 13.  Serial blood  cultures were obtained which were negative.   CHIEF COMPLAINT:  On admission, Dr. Julio Sicks began the patient on  Ancef 1  g IV every eight hours.  Consultation was obtained from oral and  maxillofacial surgery, and it was concluded that the patient was receiving  appropriate antibiotics.  Over the next two days, [name] experienced  decreased swelling, such that the area of redness and swelling under his  right eye completely resolved.  The swelling and redness of the nasal area  improved substantially, although it had not completely resolved at the time  of discharge.  There was no evidence of abscess formation.  The patient  became afebrile.  By 11/16, it was felt reasonable to discharge the patient  on oral antibiotics.  I emphasized to him the importance of taking the  antibiotic regimen until it was completed.   DISCHARGE DIAGNOSES:  1.  Nasal and facial cellulitis.  2.  Hypertension.  3.  Obesity.   DISCHARGE MEDICATIONS:  1.  Cozaar and hydrochlorothiazide as before.  2.  In addition, the patient was advised to take Keflex 500 mg q.i.d. x7      days.  3.  He was also instructed to use Nasacort AQ nasal spray, two puffs every      12 hours for nasal congestion.   FOLLOWUP:  He was advised to follow up with Dr. Julian Reil or Dr. Kevan Ny at  [facility] at [place] in five to seven days.   CONDITION ON DISCHARGE:  Good.      SY/MEDQ  D:  09/04/2004  T:  09/05/2004  Job:  [id number]   cc:   Schuyler Amor, M.D.  [address]  [place], [place] [address]  Fax: [phone]

## 2011-08-07 LAB — CBC
HCT: 40.1
Hemoglobin: 14
MCHC: 34.8
MCV: 87.6
Platelets: 338
RBC: 4.58
RDW: 12.9
WBC: 10.2

## 2011-08-07 LAB — PROTIME-INR
INR: 2.2 — ABNORMAL HIGH
Prothrombin Time: 26 — ABNORMAL HIGH

## 2012-02-20 ENCOUNTER — Ambulatory Visit (INDEPENDENT_AMBULATORY_CARE_PROVIDER_SITE_OTHER): Payer: BC Managed Care – PPO | Admitting: Family Medicine

## 2012-02-20 VITALS — BP 134/95 | HR 90 | Temp 99.1°F | Resp 20 | Ht 68.0 in | Wt 372.4 lb

## 2012-02-20 DIAGNOSIS — N390 Urinary tract infection, site not specified: Secondary | ICD-10-CM

## 2012-02-20 DIAGNOSIS — R509 Fever, unspecified: Secondary | ICD-10-CM

## 2012-02-20 DIAGNOSIS — I2699 Other pulmonary embolism without acute cor pulmonale: Secondary | ICD-10-CM

## 2012-02-20 DIAGNOSIS — I10 Essential (primary) hypertension: Secondary | ICD-10-CM | POA: Insufficient documentation

## 2012-02-20 LAB — POCT CBC
Granulocyte percent: 78 %G (ref 37–80)
HCT, POC: 46.2 % (ref 43.5–53.7)
Hemoglobin: 15.7 g/dL (ref 14.1–18.1)
Lymph, poc: 3 (ref 0.6–3.4)
MCH, POC: 30.3 pg (ref 27–31.2)
MCHC: 34 g/dL (ref 31.8–35.4)
MCV: 89.1 fL (ref 80–97)
MID (cbc): 1.7 — AB (ref 0–0.9)
MPV: 9.5 fL (ref 0–99.8)
POC Granulocyte: 16.5 — AB (ref 2–6.9)
POC LYMPH PERCENT: 14 %L (ref 10–50)
POC MID %: 8 %M (ref 0–12)
Platelet Count, POC: 265 10*3/uL (ref 142–424)
RBC: 5.19 M/uL (ref 4.69–6.13)
RDW, POC: 14.7 %
WBC: 21.2 10*3/uL — AB (ref 4.6–10.2)

## 2012-02-20 LAB — POCT URINALYSIS DIPSTICK
Bilirubin, UA: NEGATIVE
Glucose, UA: NEGATIVE
Ketones, UA: NEGATIVE
Nitrite, UA: NEGATIVE
Protein, UA: 30
Spec Grav, UA: 1.025
Urobilinogen, UA: 0.2
pH, UA: 5.5

## 2012-02-20 LAB — POCT UA - MICROSCOPIC ONLY
Casts, Ur, LPF, POC: NEGATIVE
Crystals, Ur, HPF, POC: NEGATIVE
Mucus, UA: NEGATIVE
Yeast, UA: NEGATIVE

## 2012-02-20 LAB — POCT SEDIMENTATION RATE: POCT SED RATE: 36 mm/hr — AB (ref 0–22)

## 2012-02-20 MED ORDER — CIPROFLOXACIN HCL 500 MG PO TABS: 500.0000 mg | ORAL_TABLET | Freq: Two times a day (BID) | ORAL | Status: AC

## 2012-02-20 NOTE — Patient Instructions (Signed)
Return for follow up in 10 daysUrinary Tract Infection Infections of the urinary tract can start in several places. A bladder infection (cystitis), a kidney infection (pyelonephritis), and a prostate infection (prostatitis) are different types of urinary tract infections (UTIs). They usually get better if treated with medicines (antibiotics) that kill germs. Take all the medicine until it is gone. You or your child may feel better in a few days, but TAKE ALL MEDICINE or the infection may not respond and may become more difficult to treat. HOME CARE INSTRUCTIONS   Drink enough water and fluids to keep the urine clear or pale yellow. Cranberry juice is especially recommended, in addition to large amounts of water.   Avoid caffeine, tea, and carbonated beverages. They tend to irritate the bladder.   Alcohol may irritate the prostate.   Only take over-the-counter or prescription medicines for pain, discomfort, or fever as directed by your caregiver.  To prevent further infections:  Empty the bladder often. Avoid holding urine for long periods of time.   After a bowel movement, women should cleanse from front to back. Use each tissue only once.   Empty the bladder before and after sexual intercourse.  FINDING OUT THE RESULTS OF YOUR TEST Not all test results are available during your visit. If your or your child's test results are not back during the visit, make an appointment with your caregiver to find out the results. Do not assume everything is normal if you have not heard from your caregiver or the medical facility. It is important for you to follow up on all test results. SEEK MEDICAL CARE IF:   There is back pain.   Your baby is older than 3 months with a rectal temperature of 100.5 F (38.1 C) or higher for more than 1 day.   Your or your child's problems (symptoms) are no better in 3 days. Return sooner if you or your child is getting worse.  SEEK IMMEDIATE MEDICAL CARE IF:   There  is severe back pain or lower abdominal pain.   You or your child develops chills.   You have a fever.   Your baby is older than 3 months with a rectal temperature of 102 F (38.9 C) or higher.   Your baby is 19 months old or younger with a rectal temperature of 100.4 F (38 C) or higher.   There is nausea or vomiting.   There is continued burning or discomfort with urination.  MAKE SURE YOU:   Understand these instructions.   Will watch your condition.   Will get help right away if you are not doing well or get worse.  Document Released: 07/16/2005 Document Revised: 09/25/2011 Document Reviewed: 02/18/2007 [facility] Patient Information 2012 [place], [place].

## 2012-02-20 NOTE — Progress Notes (Signed)
This is a 46 year old gentleman who comes in with his wife. 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 He presents with 2 days of fever, sweats and chills and mild sinus congestion. He has no localizing pain, no cough no dysuria no diarrhea or vomiting no bowel pain nor no chest pain.  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  Objective: Patient seen with wife, he is obese and mildly diaphoretic but in no acute distress  Skin: No rashes mildly flushed in the face  Neck: Supple no adenopathy or thyromegaly  HEENT: Recent dental extraction of tooth #13, black remaining root of tooth #19 and #28. TMs normal, respiratory negative, eyes without jaundice-normal inspection  Chest: Clear heart: Regular no murmur or gallop  Abdomen: Soft nontender without bruit or HSM.  Extremities: Unremarkable  Results for orders placed in visit on 02/20/12  POCT CBC      Component Value Range   WBC 21.2 (*) 4.6 - 10.2 (K/uL)   Lymph, poc 3.0  0.6 - 3.4    POC LYMPH PERCENT 14.0  10 - 50 (%L)   MID (cbc) 1.7 (*) 0 - 0.9    POC MID % 8.0  0 - 12 (%M)   POC Granulocyte 16.5 (*) 2 - 6.9    Granulocyte percent 78.0  37 - 80 (%G)   RBC 5.19  4.69 - 6.13 (M/uL)   Hemoglobin 15.7  14.1 - 18.1 (g/dL)   HCT, POC 16.1  09.6 - 53.7 (%)   MCV 89.1  80 - 97 (fL)   MCH, POC 30.3  27 - 31.2 (pg)   MCHC 34.0  31.8 - 35.4 (g/dL)   RDW, POC 04.5     Platelet Count, POC 265  142 - 424 (K/uL)   MPV 9.5  0 - 99.8 (fL)  POCT URINALYSIS DIPSTICK      Component Value Range   Color, UA amber     Clarity, UA sl.cloudy     Glucose, UA neg     Bilirubin, UA neg     Ketones, UA neg     Spec Grav, UA 1.025     Blood, UA mod     pH, UA 5.5     Protein, UA 30     Urobilinogen, UA 0.2     Nitrite, UA neg     Leukocytes, UA small (1+)    POCT UA - MICROSCOPIC ONLY      Component Value Range   WBC, Ur, HPF, POC  16-20     RBC, urine, microscopic 15-18     Bacteria, U Microscopic 2+     Mucus, UA neg     Epithelial cells, urine per micros 2-5     Crystals, Ur, HPF, POC neg     Casts, Ur, LPF, POC neg     Yeast, UA neg     A:  UTI, essentially asymptomatic except for the fever  P:  Cipro  500 mg bid x 10 days Check U.C. Follow up 10 days

## 2012-02-22 LAB — URINE CULTURE: Colony Count: >=100000

## 2015-01-01 ENCOUNTER — Telehealth (HOSPITAL_COMMUNITY): Payer: Self-pay | Admitting: Cardiology

## 2015-01-01 NOTE — Telephone Encounter (Signed)
[facility] [place] for appointment on 01/03/15 with Dr SwazilandJordan.  [facility] for Dr Elvis CoilJordan's schedule on 01/03/15. lp

## 2015-01-03 ENCOUNTER — Ambulatory Visit (INDEPENDENT_AMBULATORY_CARE_PROVIDER_SITE_OTHER): Payer: BLUE CROSS/BLUE SHIELD | Admitting: Cardiology

## 2015-01-03 ENCOUNTER — Encounter: Payer: Self-pay | Admitting: Cardiology

## 2015-01-03 VITALS — BP 158/106 | HR 67 | Ht 68.0 in | Wt 388.9 lb

## 2015-01-03 DIAGNOSIS — I2699 Other pulmonary embolism without acute cor pulmonale: Secondary | ICD-10-CM | POA: Diagnosis not present

## 2015-01-03 DIAGNOSIS — G4733 Obstructive sleep apnea (adult) (pediatric): Secondary | ICD-10-CM | POA: Insufficient documentation

## 2015-01-03 DIAGNOSIS — Z9889 Other specified postprocedural states: Secondary | ICD-10-CM | POA: Diagnosis not present

## 2015-01-03 DIAGNOSIS — I10 Essential (primary) hypertension: Secondary | ICD-10-CM | POA: Diagnosis not present

## 2015-01-03 DIAGNOSIS — Z95828 Presence of other vascular implants and grafts: Secondary | ICD-10-CM | POA: Insufficient documentation

## 2015-01-03 NOTE — Progress Notes (Signed)
Margarette Asal Wiltsey Date of Birth: [birth year] Medical Record #[id number]  History of Present Illness: Mr. Sivils is seen at the request of Dr. Andrey Campanile for questions regarding his history of pulmonary embolus and the placement of a Greenfield filter. He is a mobidly obese WM with history of HTN, tobacco abuse, and OSA. In 2008 he suffered a pulmonary embolus. He was anticoagulated with coumadin. Hypercoaguable work up by hematology was negative. He was DC home then a week to 10 days later he had a recurrent PE. He had a Greenfield IVC filter placed at that time. He has been on chronic coumadin since that time without recurrent DVT or PE. The filter was never removed. He has since heard of some lawsuits concerning these filters and wanted to be evaluated. He denies any symptoms of SOB, chest pain, or edema. No abdominal complaints. INRs have been therapeutic.  He does report a history of OSA by sleep study in 2005. He was considered for pharyngeal surgery but never had this done. He never used CPAP and has not been re-evaluated. He smokes a couple of cigs per day. 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 As part of this he plans to exercise regularly and lose weight. He is planning on quitting smoking.    (Not in a hospital admission)  Allergies  Allergen Reactions  . Plasma Protein Fraction     Past Medical History  Diagnosis Date  . Clotting disorder   . Hypertension   . 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 Morbid obesity   . OSA (obstructive sleep apnea)   . MRSA infection 2005    facial  . GERD (gastroesophageal reflux disease)   . Lumbar disc disease     Past Surgical History  Procedure Laterality Date  . Vena cava filter placement  2008    History   Social History  . Marital Status: Divorced    Spouse Name: N/A  . Number of Children: N/A  . Years of Education: N/A   Occupational History  . security      Social History Main Topics  . Smoking status: Current Every Day Smoker -- 0.25 packs/day    Types: Cigarettes  . Smokeless tobacco: Not on file  . Alcohol Use: Not on file  . Drug Use: Not on file  . Sexual Activity: Not on file   Other Topics Concern  . None   Social History Narrative    Family History  Problem Relation Age of Onset  . Hypertension Mother   . Pulmonary embolism Father   . Brain cancer Sister   . Diabetes Father     Review of Systems: As noted in HPI.  All other systems were reviewed and are negative.  Physical Exam: BP 158/106 mmHg  Pulse 67  Ht  (1.727 m)  Wt 388 lb 14.4 oz (176.404 kg)  BMI 59.15 kg/m2 Filed Weights   01/03/15 0911  Weight: 388 lb 14.4 oz (176.404 kg)   GENERAL:  Well appearing, morbidly obese WM in NAD.  HEENT:  PERRL, EOMI, sclera are clear. Oropharynx is clear. NECK:  No jugular venous distention, carotid upstroke brisk and symmetric, no bruits, no thyromegaly or adenopathy LUNGS:  Clear to auscultation bilaterally CHEST:  Unremarkable HEART:  RRR,  PMI not displaced or sustained,S1 and S2 within normal limits, no S3, no S4: no clicks, no rubs, no murmurs ABD:  Soft,  nontender. Obese, BS +, no masses or bruits. No hepatomegaly, no splenomegaly EXT:  2 + pulses throughout, no edema, no cyanosis no clubbing SKIN:  Warm and dry.  No rashes NEURO:  Alert and oriented x 3. Cranial nerves II through XII intact. PSYCH:  Cognitively intact   LABORATORY DATA: Ecg today shows NSR with rate 67. Normal. I have personally reviewed and interpreted this study.   Assessment / Plan: 1. Remote history of recurrent PE (episodes within 7-10 days of each other). On chronic coumadin therapy. He does have a Greenfield filter that has been in place for over 8 yrs. We discussed the fact that he has had no problems with the filter in place over this time. Currently these filters would be removed after the acute episode but I don't think  there is an advantage to removing the filter at this late date and may cause harm from the procedure (he apparently had significant bleeding from his IJ site when filter placed). I have recommended no change in his treatment currently with coumadin. If he did develop symptoms related to his filter (migration, venous obstruction, infection ) we could ask interventional radiology about its removal.  2. HTN- BP poorly controlled today. He reports better readings at home and has not taken his meds today.  3. History of OSA.  4. Morbid obesity.   5. Tobacco abuse.   The patient is at increased risk for cardiovascular disease due to the above risk factors. I explained that ultimately his risk will depend on how well he can modify his lifestyle with smoking cessation, major weight loss, exercise, and BP control. He seem motivated and has plan for change. I would encourage him to be re-evaluated for OSA again. He will consider this but notes he would have to factor this into his plans for traveling with his new job. I will see back as needed.

## 2015-01-03 NOTE — Patient Instructions (Signed)
I applaud your plans to loose weight, exercise more and quit smoking.  Consider a sleep study when it is convenient for you  I would leave your IVC filter in unless it causes problems in the future

## 2015-01-04 ENCOUNTER — Encounter: Payer: Self-pay | Admitting: Cardiology

## 2015-05-07 ENCOUNTER — Telehealth: Payer: Self-pay | Admitting: Physician Assistant

## 2015-05-07 ENCOUNTER — Ambulatory Visit (INDEPENDENT_AMBULATORY_CARE_PROVIDER_SITE_OTHER): Payer: BLUE CROSS/BLUE SHIELD | Admitting: Physician Assistant

## 2015-05-07 ENCOUNTER — Ambulatory Visit (HOSPITAL_COMMUNITY)
Admission: RE | Admit: 2015-05-07 | Discharge: 2015-05-07 | Disposition: A | Discharge status: Home / Self Care | Payer: BLUE CROSS/BLUE SHIELD | Source: Ambulatory Visit | Attending: Physician Assistant | Admitting: Physician Assistant

## 2015-05-07 VITALS — BP 138/100 | HR 92 | Temp 98.1°F | Resp 16 | Ht 68.0 in | Wt 388.0 lb

## 2015-05-07 DIAGNOSIS — M7989 Other specified soft tissue disorders: Secondary | ICD-10-CM | POA: Insufficient documentation

## 2015-05-07 DIAGNOSIS — R609 Edema, unspecified: Secondary | ICD-10-CM

## 2015-05-07 DIAGNOSIS — Z86718 Personal history of other venous thrombosis and embolism: Secondary | ICD-10-CM

## 2015-05-07 DIAGNOSIS — R600 Localized edema: Secondary | ICD-10-CM

## 2015-05-07 DIAGNOSIS — L03116 Cellulitis of left lower limb: Secondary | ICD-10-CM

## 2015-05-07 MED ORDER — TRIAMCINOLONE ACETONIDE 0.1 % EX CREA: 1.0000 "application " | TOPICAL_CREAM | Freq: Two times a day (BID) | CUTANEOUS | Status: DC

## 2015-05-07 MED ORDER — CEPHALEXIN 500 MG PO CAPS: 500.0000 mg | ORAL_CAPSULE | Freq: Four times a day (QID) | ORAL | Status: DC

## 2015-05-07 NOTE — Progress Notes (Signed)
Subjective:    Patient ID: [name], male    DOB: 08-20-1966, 49 y.o.   MRN: [id number]  Chief Complaint  Patient presents with  . Recurrent Skin Infections    left leg   Patient Active Problem List   Diagnosis Date Noted  . S/P IVC filter 01/03/2015  . Morbid obesity 01/03/2015  . OSA (obstructive sleep apnea) 01/03/2015  . Hypertension 02/20/2012  . Pulmonary emboli 02/20/2012   Prior to Admission medications   Medication Sig Start Date End Date Taking? Authorizing Provider  cetirizine-pseudoephedrine (ZYRTEC-D) 5-120 MG per tablet Take 1 tablet by mouth 2 (two) times daily.   Yes Historical Provider, MD  glucosamine-chondroitin 500-400 MG tablet Take 1 tablet by mouth as needed.    Yes Historical Provider, MD  hydrALAZINE (APRESOLINE) 10 MG tablet Take 10 mg by mouth 2 (two) times daily.    Yes Historical Provider, MD  hydrochlorothiazide (HYDRODIURIL) 25 MG tablet Take 25 mg by mouth daily.   Yes Historical Provider, MD  lisinopril (PRINIVIL,ZESTRIL) 40 MG tablet Take 40 mg by mouth daily.   Yes Historical Provider, MD  warfarin (COUMADIN) 5 MG tablet Take 5 mg by mouth 2 (two) times daily.    Yes Historical Provider, MD  cephALEXin (KEFLEX) 500 MG capsule Take 1 capsule (500 mg total) by mouth 4 (four) times daily. 05/07/15   Raelyn Ensign, PA  magnesium 30 MG tablet Take 30 mg by mouth as needed.     Historical Provider, MD  potassium chloride (K-DUR,KLOR-CON) 10 MEQ tablet Take 10 mEq by mouth as needed.     Historical Provider, MD  triamcinolone cream (KENALOG) 0.1 % Apply 1 application topically 2 (two) times daily. 05/07/15   Raelyn Ensign, PA   Medications, allergies, past medical history, surgical history, family history, social history and problem list reviewed and updated.  HPI  10 yom presents with concern for LE cellulitis.   Sx started approx 5-6 days ago with mild left LE swelling and increasing redness. He started cipro for a uti around this same time and  the redness has decreased about 50% past 2 days. Denies trauma to leg. He has been driving very long distances for work past few months. He is obese, does not wear compression stockings. Last had cellulitis approx 10 yrs ago. Had fevers 102 3 days ago but none since.   Has hx dvts, has been on coumadin for many yrs and has an IVC filter. Checks INR through his pcp.   Review of Systems No fevers, chills, cp, sob.     Objective:   Physical Exam  Constitutional: He is oriented to person, place, and time.  BP 138/100 mmHg  Pulse 92  Temp(Src) 98.1 F (36.7 C) (Oral)  Resp 16  Ht  (1.727 m)  Wt 388 lb (175.996 kg)  BMI 59.01 kg/m2  SpO2 99%   Cardiovascular: Normal rate, regular rhythm and normal heart sounds.   Pulses:      Dorsalis pedis pulses are 2+ on the left side.       Posterior tibial pulses are 2+ on the left side.  Neurological: He is alert and oriented to person, place, and time.  Skin:     Hyperpigmentation with possible erythema over circled area. No warmth. Bilateral leg swelling slightly worse LLE. 2+ pitting edema up to mid shin. Normal sensation LLE. Normal strength ankle. Normal cap refill.       Assessment & Plan:   Cellulitis of leg, left -  Plan: cephALEXin (KEFLEX) 500 MG capsule, triamcinolone cream (KENALOG) 0.1 %  Peripheral edema - Plan: VAS US LOWER EXTREMITY VENOUS (DVT)  History of DVT (deep vein thrombosis) - Plan: VAS US LOWER EXTREMITY VENOUS (DVT) --suspect venous stasis changes due to weight, long travel past few months --compression stockings, elevation, weight loss, limit sodium --possible mild cellulitis --> keflex 10 days, start probiotic as also finishing cipro course for uti, triamcinolone topical bid --instructed to inform pcp of recent antibiotics as he is on coumadin --doppler today due to recent travel/swelling -- will f/u on results  Donnajean Lopesodd M. Truxton Stupka, PA-C Physician Assistant-Certified [facility]  05/07/2015 12:43 PM

## 2015-05-07 NOTE — Telephone Encounter (Signed)
Called and informed pt of normal doppler with no DVT.

## 2015-05-07 NOTE — Progress Notes (Signed)
VASCULAR LAB PRELIMINARY  PRELIMINARY  PRELIMINARY  PRELIMINARY  Left lower extremity venous Doppler completed.    Preliminary report:  There is no DVT or SVT noted in the left lower extremity.   Jaramie Bastos, RVT 05/07/2015, 3:01 PM

## 2015-05-07 NOTE — Patient Instructions (Addendum)
You have lower extremity stasis dermatitis changes.  Be sure to wear compression stockings when seated for long periods, elevate your legs when home, try to get some activity every 2 hours while driving, and weight loss are important.  Please take the keflex 4 times daily for 10 days.  Apply the steroid cream twice daily to the red area.  Be sure to take a probiotic with all the antibiotics you've been on recently.  Be sure to let your pcp/coumdain clinic know about all the antibiotics you've been on as they can change your coumadin levels.  [facility] to [facility] for a lower extremity ultrasound. Your appointment is at 3:00 pm today.  I'll let you know these results.

## 2019-01-23 ENCOUNTER — Encounter (HOSPITAL_COMMUNITY): Payer: Self-pay | Admitting: Emergency Medicine

## 2019-01-23 ENCOUNTER — Other Ambulatory Visit: Payer: Self-pay

## 2019-01-23 ENCOUNTER — Emergency Department (HOSPITAL_COMMUNITY)
Admission: EM | Admit: 2019-01-23 | Discharge: 2019-01-23 | Disposition: A | Discharge status: Home / Self Care | Payer: BLUE CROSS/BLUE SHIELD | Attending: Emergency Medicine | Admitting: Emergency Medicine

## 2019-01-23 DIAGNOSIS — Z79899 Other long term (current) drug therapy: Secondary | ICD-10-CM | POA: Insufficient documentation

## 2019-01-23 DIAGNOSIS — R040 Epistaxis: Secondary | ICD-10-CM | POA: Insufficient documentation

## 2019-01-23 DIAGNOSIS — Z87891 Personal history of nicotine dependence: Secondary | ICD-10-CM | POA: Insufficient documentation

## 2019-01-23 DIAGNOSIS — I10 Essential (primary) hypertension: Secondary | ICD-10-CM | POA: Insufficient documentation

## 2019-01-23 LAB — PROTIME-INR
INR: 1.9 — ABNORMAL HIGH (ref 0.8–1.2)
Prothrombin Time: 21.2 seconds — ABNORMAL HIGH (ref 11.4–15.2)

## 2019-01-23 MED ORDER — SILVER NITRATE-POT NITRATE 75-25 % EX MISC
1.0000 "application " | Freq: Once | CUTANEOUS | Status: AC
  Administered 2019-01-23: 1 via TOPICAL
  Filled 2019-01-23: qty 1

## 2019-01-23 MED ORDER — OXYMETAZOLINE HCL 0.05 % NA SOLN
1.0000 | Freq: Once | NASAL | Status: AC
  Administered 2019-01-23: 07:00:00 1 via NASAL
  Filled 2019-01-23: qty 30

## 2019-01-23 NOTE — Discharge Instructions (Addendum)
Your seen today for nosebleed.  If nosebleed recurs, blow out clots and use Afrin.  Place a humidifier in your room.  Additionally, you were found to be hypertensive.  Make sure to take your blood pressure medications as prescribed.  Follow-up with your primary doctor.  ENT follow-up was also provided.

## 2019-01-23 NOTE — ED Notes (Signed)
Patient verbalizes understanding of discharge instructions. Opportunity for questioning and answers were provided. Armband removed by staff, pt discharged from ED.  

## 2019-01-23 NOTE — ED Provider Notes (Signed)
[facility] EMERGENCY DEPARTMENT Provider Note   CSN: [id number] Arrival date & time: 01/23/19  0609    History   Chief Complaint Chief Complaint  Patient presents with  . Epistaxis    HPI [name] is a 53 y.o. male.     HPI  This is a 53 year old male who presents with epistaxis.  Patient reports that he woke up this morning and noted bleeding.  He believes it is out of both nares.  He does not have any history of nosebleeds.  Patient does report that he is on Coumadin for recurrent pulmonary embolus.  Last INR check was in late December.  He denies any recent upper respiratory symptoms including cough, fever, runny nose.  He does report that he has been to high elevations which usually "dries out my sinuses."  Past Medical History:  Diagnosis Date  . Clotting disorder (HCC)   . GERD (gastroesophageal reflux disease)   . Hypertension   . Lumbar disc disease   . Morbid obesity (HCC)   . MRSA infection 2005   facial  . OSA (obstructive sleep apnea)   . 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 S/P IVC filter 01/03/2015  . Morbid obesity (HCC) 01/03/2015  . OSA (obstructive sleep apnea) 01/03/2015  . Hypertension 02/20/2012  . Pulmonary emboli (HCC) 02/20/2012    Past Surgical History:  Procedure Laterality Date  . VENA CAVA FILTER PLACEMENT  2008        Home Medications    Prior to Admission medications   Medication Sig Start Date End Date Taking? Authorizing Provider  cephALEXin (KEFLEX) 500 MG capsule Take 1 capsule (500 mg total) by mouth 4 (four) times daily. 05/07/15   McVeigh, Todd, PA  cetirizine-pseudoephedrine (ZYRTEC-D) 5-120 MG per tablet Take 1 tablet by mouth 2 (two) times daily.    [provider]  glucosamine-chondroitin 500-400 MG tablet Take 1 tablet by mouth as needed.     [provider]  hydrALAZINE (APRESOLINE) 10 MG tablet Take 10 mg by  mouth 2 (two) times daily.     [provider]  hydrochlorothiazide (HYDRODIURIL) 25 MG tablet Take 25 mg by mouth daily.    [provider]  lisinopril (PRINIVIL,ZESTRIL) 40 MG tablet Take 40 mg by mouth daily.    [provider]  magnesium 30 MG tablet Take 30 mg by mouth as needed.     [provider]  potassium chloride (K-DUR,KLOR-CON) 10 MEQ tablet Take 10 mEq by mouth as needed.     [provider]  triamcinolone cream (KENALOG) 0.1 % Apply 1 application topically 2 (two) times daily. 05/07/15   McVeigh, Tawanna Cooler, PA  warfarin (COUMADIN) 5 MG tablet Take 5 mg by mouth 2 (two) times daily.     [provider]    Family History Family History  Problem Relation Age of Onset  . Hypertension Mother   . Pulmonary embolism Father   . Diabetes Father   . Brain cancer Sister     Social History Social History   Tobacco Use  . Smoking status: Former Smoker    Packs/day: 0.25    Types: Cigarettes    Last attempt to quit: 01/19/2015    Years since quitting: 4.0  . Smokeless tobacco: Never Used  Substance Use Topics  . Alcohol use: Not on file  . Drug use: Not on file  Allergies   Plasma protein fraction   Review of Systems Review of Systems  Constitutional: Negative for chills and fever.  HENT: Positive for nosebleeds.   Respiratory: Negative for shortness of breath.   Cardiovascular: Negative for chest pain.  Gastrointestinal: Negative for abdominal pain.  All other systems reviewed and are negative.    Physical Exam Updated Vital Signs BP (!) 206/110 (BP Location: Right Arm)   Pulse 87   Temp 98.4 F (36.9 C) (Oral)   Resp 18   Ht 1.727 m (5\' 8" )   Wt (!) 172.4 kg   SpO2 97%   BMI 57.78 kg/m   Physical Exam Vitals signs and nursing note reviewed.  Constitutional:      Appearance: He is well-developed.     Comments: Morbidly obese, nontoxic-appearing  HENT:     Head: Normocephalic and atraumatic.      Nose:     Comments: Blood noted mostly in the right naris, there is friable tissue noted over the right nasal septum, no septal hematoma Eyes:     Pupils: Pupils are equal, round, and reactive to light.  Neck:     Musculoskeletal: Neck supple.  Cardiovascular:     Rate and Rhythm: Normal rate and regular rhythm.     Heart sounds: Normal heart sounds.  Pulmonary:     Effort: Pulmonary effort is normal. No respiratory distress.     Breath sounds: Normal breath sounds. No wheezing.  Abdominal:     Palpations: Abdomen is soft.     Tenderness: There is no abdominal tenderness.  Lymphadenopathy:     Cervical: No cervical adenopathy.  Skin:    General: Skin is warm and dry.  Neurological:     Mental Status: He is alert and oriented to person, place, and time.  Psychiatric:        Mood and Affect: Mood normal.      ED Treatments / Results  Labs (all labs ordered are listed, but only abnormal results are displayed) Labs Reviewed  PROTIME-INR - Abnormal; Notable for the following components:      Result Value   Prothrombin Time 21.2 (*)    INR 1.9 (*)    All other components within normal limits    EKG None  Radiology No results found.  Procedures .Epistaxis Management Date/Time: 01/23/2019 6:53 AM Performed by: Shon Baton, MD Authorized by: Shon Baton, MD   Consent:    Consent obtained:  Verbal   Consent given by:  Patient   Risks discussed:  Bleeding   Alternatives discussed:  No treatment Anesthesia (see MAR for exact dosages):    Anesthesia method:  None Procedure details:    Treatment site:  R anterior   Treatment method:  Silver nitrate   Treatment complexity:  Limited   Treatment episode: initial   Post-procedure details:    Assessment:  Bleeding stopped   Patient tolerance of procedure:  Tolerated well, no immediate complications   (including critical care time)  Medications Ordered in ED Medications  silver nitrate applicators  applicator 1 application (1 application Topical Given 01/23/19 0633)  oxymetazoline (AFRIN) 0.05 % nasal spray 1 spray (1 spray Each Nare Given 01/23/19 7544)     Initial Impression / Assessment and Plan / ED Course  I have reviewed the triage vital signs and the nursing notes.  Pertinent labs & imaging results that were available during my care of the patient were reviewed by me and considered in my medical decision making (  see chart for details).       Patient presents with epistaxis.  He is nontoxic-appearing and vital signs are reassuring.  Bleeding has slowed.  He appears to have some friable tissue over the right nasal septum.  He is also on anticoagulants.  Bleeding was cauterized and patient did not have any recurrent bleed.  INR is 1.9.  Patient was given instructions regarding recurrent nosebleeds and prevention.  ENT follow-up provided.  Of note, he was notably hypertensive.  He reports compliance with blood pressure medication.  He is otherwise asymptomatic without signs or symptoms of hypertensive urgency or emergency.  Recommend taking medications and follow-up with primary physician for recheck.  After history, exam, and medical workup I feel the patient has been appropriately medically screened and is safe for discharge home. Pertinent diagnoses were discussed with the patient. Patient was given return precautions.   Final Clinical Impressions(s) / ED Diagnoses   Final diagnoses:  Epistaxis  Essential hypertension    ED Discharge Orders    None       Horton, Mayer Masker, MD 01/23/19 [phone]

## 2019-01-23 NOTE — ED Triage Notes (Addendum)
Patient reports epistaxis to both nares this morning , denies nasal injury , he is taking Coumadin 5 days a week - history of PE . Hypertensive at arrival .

## 2019-01-24 ENCOUNTER — Other Ambulatory Visit: Payer: Self-pay

## 2019-01-24 ENCOUNTER — Emergency Department (HOSPITAL_COMMUNITY)
Admission: EM | Admit: 2019-01-24 | Discharge: 2019-01-24 | Disposition: A | Discharge status: Home / Self Care | Payer: BLUE CROSS/BLUE SHIELD | Attending: Emergency Medicine | Admitting: Emergency Medicine

## 2019-01-24 ENCOUNTER — Encounter (HOSPITAL_COMMUNITY): Payer: Self-pay | Admitting: Emergency Medicine

## 2019-01-24 DIAGNOSIS — Z79899 Other long term (current) drug therapy: Secondary | ICD-10-CM | POA: Insufficient documentation

## 2019-01-24 DIAGNOSIS — R040 Epistaxis: Secondary | ICD-10-CM | POA: Diagnosis not present

## 2019-01-24 DIAGNOSIS — I10 Essential (primary) hypertension: Secondary | ICD-10-CM | POA: Diagnosis not present

## 2019-01-24 DIAGNOSIS — Z87891 Personal history of nicotine dependence: Secondary | ICD-10-CM | POA: Insufficient documentation

## 2019-01-24 MED ORDER — TRANEXAMIC ACID 1000 MG/10ML IV SOLN
500.0000 mg | Freq: Once | INTRAVENOUS | Status: AC
  Administered 2019-01-24: 500 mg via TOPICAL
  Filled 2019-01-24: qty 10

## 2019-01-24 MED ORDER — PHENYLEPHRINE HCL 0.5 % NA SOLN
1.0000 [drp] | Freq: Once | NASAL | Status: AC
  Administered 2019-01-24: 1 [drp] via NASAL
  Filled 2019-01-24: qty 15

## 2019-01-24 NOTE — ED Triage Notes (Signed)
Pt c/o nose bleed that started 2 hours PTA. Pt seen yesterday for same, reports he did not take his warfarin yesterday. Bleeding controlled at this time.

## 2019-01-24 NOTE — Discharge Instructions (Signed)
We saw in the ER for nosebleed. Given that the bleeding was persistent despite our attempts to stop it, we had to pack your nose with a rapid Rhino device.  Please follow-up with ENT doctors in 2 days to get the device removed.

## 2019-01-24 NOTE — ED Provider Notes (Signed)
[facility] EMERGENCY DEPARTMENT Provider Note   CSN: [id number] Arrival date & time: 01/24/19  9480    History   Chief Complaint Chief Complaint  Patient presents with  . Epistaxis    HPI CORRION [name] is a 53 y.o. male.     HPI  53 year old male comes in a chief complaint of bloody nose.  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  He was seen yesterday in the ER for bloody right-sided nare.  At that time patient had chemical cauterization performed with silver nitrate and he states that his bleeding stopped.  However, patient woke up in the middle the night with bleeding from his right nare again.  He waited 2 hours at home trying to control the bleed with Afrin and pressure, but the bleeding persisted therefore he decided to come to the ER.  Patient's INR yesterday was 1.2 and he did not take any Coumadin after he was discharged.  Past Medical History:  Diagnosis Date  . Clotting disorder (HCC)   . GERD (gastroesophageal reflux disease)   . Hypertension   . Lumbar disc disease   . Morbid obesity (HCC)   . MRSA infection 2005   facial  . OSA (obstructive sleep apnea)   . 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 S/P IVC filter 01/03/2015  . Morbid obesity (HCC) 01/03/2015  . OSA (obstructive sleep apnea) 01/03/2015  . Hypertension 02/20/2012  . Pulmonary emboli (HCC) 02/20/2012    Past Surgical History:  Procedure Laterality Date  . VENA CAVA FILTER PLACEMENT  2008        Home Medications    Prior to Admission medications   Medication Sig Start Date End Date Taking? Authorizing Provider  allopurinol (ZYLOPRIM) 100 MG tablet Take 100 mg by mouth daily. 11/05/18  Yes [provider]  hydrALAZINE (APRESOLINE) 10 MG tablet Take 10 mg by mouth 2 (two) times daily.    Yes [provider]  hydrochlorothiazide (HYDRODIURIL) 25 MG tablet Take 25 mg by mouth daily.    Yes [provider]  lisinopril (PRINIVIL,ZESTRIL) 40 MG tablet Take 40 mg by mouth daily.   Yes [provider]  warfarin (COUMADIN) 5 MG tablet Take 5 mg by mouth 2 (two) times daily.    Yes [provider]  cephALEXin (KEFLEX) 500 MG capsule Take 1 capsule (500 mg total) by mouth 4 (four) times daily. Patient not taking: Reported on 01/24/2019 05/07/15   Raelyn Ensign, PA  triamcinolone cream (KENALOG) 0.1 % Apply 1 application topically 2 (two) times daily. Patient not taking: Reported on 01/24/2019 05/07/15   Raelyn Ensign, PA    Family History Family History  Problem Relation Age of Onset  . Hypertension Mother   . Pulmonary embolism Father   . Diabetes Father   . Brain cancer Sister     Social History Social History   Tobacco Use  . Smoking status: Former Smoker    Packs/day: 0.25    Types: Cigarettes    Last attempt to quit: 01/19/2015    Years since quitting: 4.0  . Smokeless tobacco: Never Used  Substance Use Topics  . Alcohol use: Not on file  . Drug use: Not on file     Allergies   Plasma protein fraction   Review of Systems Review of Systems  Constitutional: Positive for activity change.  HENT: Positive for nosebleeds.  Neurological: Negative for dizziness.  Hematological: Bruises/bleeds easily.     Physical Exam Updated Vital Signs BP (!) 142/90   Pulse 84   Temp 97.8 F (36.6 C) (Oral)   SpO2 94%   Physical Exam Vitals signs and nursing note reviewed.  Constitutional:      Appearance: He is well-developed.  HENT:     Head: Atraumatic.     Nose:     Comments: Patient is passing large clots from his right nare.  There is no clear active bleeding appreciated on my exam Neck:     Musculoskeletal: Neck supple.  Cardiovascular:     Rate and Rhythm: Normal rate.  Pulmonary:     Effort: Pulmonary effort is normal.  Skin:    General: Skin is warm.  Neurological:     Mental Status: He is alert and oriented to person,  place, and time.      ED Treatments / Results  Labs (all labs ordered are listed, but only abnormal results are displayed) Labs Reviewed - No data to display  EKG None  Radiology No results found.  Procedures .Epistaxis Management Date/Time: 01/24/2019 7:08 AM Performed by: Derwood Kaplan, MD Authorized by: Derwood Kaplan, MD   Consent:    Consent obtained:  Verbal   Consent given by:  Patient   Risks discussed:  Bleeding, infection, nasal injury and pain Universal protocol:    Procedure explained and questions answered to patient or proxy's satisfaction: yes     Patient identity confirmed:  Arm band Anesthesia (see MAR for exact dosages):    Anesthesia method:  None Procedure details:    Treatment site:  R anterior   Treatment method:  Nasal tampon   Treatment episode: recurring   Post-procedure details:    Assessment:  Bleeding stopped   Patient tolerance of procedure:  Tolerated well, no immediate complications   (including critical care time)  Medications Ordered in ED Medications  phenylephrine (NEO-SYNEPHRINE) 0.5 % nasal solution 1 drop (1 drop Each Nare Given 01/24/19 0418)  tranexamic acid (CYKLOKAPRON) injection 500 mg (500 mg Topical Given 01/24/19 0457)     Initial Impression / Assessment and Plan / ED Course  I have reviewed the triage vital signs and the nursing notes.  Pertinent labs & imaging results that were available during my care of the patient were reviewed by me and considered in my medical decision making (see chart for details).        53 year old male with history of PE on Coumadin comes in with chief complaint of persistent-recurrent right-sided nosebleed.  We attempted to stop the bleed with direct manual pressure along with Afrin spray.  We also attempted to stop the bleed with TXA application.  The bleeding continued and we decided to proceed with nasal packing.  Rhino Rocket 5.5 was placed without any difficulty.  Patient was  assessed in the ER for additional 30 minutes after the placement.  No complications.  Stable for discharge.  Final Clinical Impressions(s) / ED Diagnoses   Final diagnoses:  Anterior epistaxis    ED Discharge Orders    None       Derwood Kaplan, MD 01/24/19 [phone]

## 2022-11-03 ENCOUNTER — Inpatient Hospital Stay (HOSPITAL_COMMUNITY)
Admission: EM | Admit: 2022-11-03 | Discharge: 2022-11-06 | DRG: 603 | Disposition: A | Discharge status: Home / Self Care | Payer: 59 | Attending: Internal Medicine | Admitting: Internal Medicine

## 2022-11-03 ENCOUNTER — Emergency Department (HOSPITAL_COMMUNITY): Payer: 59

## 2022-11-03 DIAGNOSIS — I10 Essential (primary) hypertension: Secondary | ICD-10-CM | POA: Diagnosis present

## 2022-11-03 DIAGNOSIS — Z833 Family history of diabetes mellitus: Secondary | ICD-10-CM

## 2022-11-03 DIAGNOSIS — Z95828 Presence of other vascular implants and grafts: Secondary | ICD-10-CM

## 2022-11-03 DIAGNOSIS — E119 Type 2 diabetes mellitus without complications: Secondary | ICD-10-CM | POA: Diagnosis present

## 2022-11-03 DIAGNOSIS — L0291 Cutaneous abscess, unspecified: Secondary | ICD-10-CM | POA: Diagnosis present

## 2022-11-03 DIAGNOSIS — Z6841 Body Mass Index (BMI) 40.0 and over, adult: Secondary | ICD-10-CM

## 2022-11-03 DIAGNOSIS — D72829 Elevated white blood cell count, unspecified: Secondary | ICD-10-CM | POA: Diagnosis present

## 2022-11-03 DIAGNOSIS — Z86718 Personal history of other venous thrombosis and embolism: Secondary | ICD-10-CM

## 2022-11-03 DIAGNOSIS — Z79899 Other long term (current) drug therapy: Secondary | ICD-10-CM | POA: Diagnosis not present

## 2022-11-03 DIAGNOSIS — K219 Gastro-esophageal reflux disease without esophagitis: Secondary | ICD-10-CM | POA: Diagnosis present

## 2022-11-03 DIAGNOSIS — Z23 Encounter for immunization: Secondary | ICD-10-CM

## 2022-11-03 DIAGNOSIS — G4733 Obstructive sleep apnea (adult) (pediatric): Secondary | ICD-10-CM | POA: Diagnosis present

## 2022-11-03 DIAGNOSIS — Z87891 Personal history of nicotine dependence: Secondary | ICD-10-CM

## 2022-11-03 DIAGNOSIS — Z881 Allergy status to other antibiotic agents status: Secondary | ICD-10-CM

## 2022-11-03 DIAGNOSIS — Z888 Allergy status to other drugs, medicaments and biological substances status: Secondary | ICD-10-CM | POA: Diagnosis not present

## 2022-11-03 DIAGNOSIS — Z808 Family history of malignant neoplasm of other organs or systems: Secondary | ICD-10-CM | POA: Diagnosis not present

## 2022-11-03 DIAGNOSIS — Z7901 Long term (current) use of anticoagulants: Secondary | ICD-10-CM

## 2022-11-03 DIAGNOSIS — Z8614 Personal history of Methicillin resistant Staphylococcus aureus infection: Secondary | ICD-10-CM | POA: Diagnosis not present

## 2022-11-03 DIAGNOSIS — Z86711 Personal history of pulmonary embolism: Secondary | ICD-10-CM

## 2022-11-03 DIAGNOSIS — L02214 Cutaneous abscess of groin: Secondary | ICD-10-CM | POA: Diagnosis present

## 2022-11-03 DIAGNOSIS — Z8249 Family history of ischemic heart disease and other diseases of the circulatory system: Secondary | ICD-10-CM | POA: Diagnosis not present

## 2022-11-03 LAB — CBC WITH DIFFERENTIAL/PLATELET
Abs Immature Granulocytes: 0.03 10*3/uL (ref 0.00–0.07)
Basophils Absolute: 0.1 10*3/uL (ref 0.0–0.1)
Basophils Relative: 1 %
Eosinophils Absolute: 0.3 10*3/uL (ref 0.0–0.5)
Eosinophils Relative: 2 %
HCT: 45.1 % (ref 39.0–52.0)
Hemoglobin: 14.5 g/dL (ref 13.0–17.0)
Immature Granulocytes: 0 %
Lymphocytes Relative: 12 %
Lymphs Abs: 1.6 10*3/uL (ref 0.7–4.0)
MCH: 28.6 pg (ref 26.0–34.0)
MCHC: 32.2 g/dL (ref 30.0–36.0)
MCV: 89 fL (ref 80.0–100.0)
Monocytes Absolute: 1.1 10*3/uL — ABNORMAL HIGH (ref 0.1–1.0)
Monocytes Relative: 9 %
Neutro Abs: 9.7 10*3/uL — ABNORMAL HIGH (ref 1.7–7.7)
Neutrophils Relative %: 76 %
Platelets: 223 10*3/uL (ref 150–400)
RBC: 5.07 MIL/uL (ref 4.22–5.81)
RDW: 15.3 % (ref 11.5–15.5)
WBC: 12.8 10*3/uL — ABNORMAL HIGH (ref 4.0–10.5)
nRBC: 0 % (ref 0.0–0.2)

## 2022-11-03 LAB — COMPREHENSIVE METABOLIC PANEL
ALT: 27 U/L (ref 0–44)
AST: 25 U/L (ref 15–41)
Albumin: 3.4 g/dL — ABNORMAL LOW (ref 3.5–5.0)
Alkaline Phosphatase: 37 U/L — ABNORMAL LOW (ref 38–126)
Anion gap: 7 (ref 5–15)
BUN: 20 mg/dL (ref 6–20)
CO2: 26 mmol/L (ref 22–32)
Calcium: 9 mg/dL (ref 8.9–10.3)
Chloride: 106 mmol/L (ref 98–111)
Creatinine, Ser: 0.84 mg/dL (ref 0.61–1.24)
GFR, Estimated: >60 mL/min (ref >60)
Glucose, Bld: 92 mg/dL (ref 70–99)
Potassium: 3.6 mmol/L (ref 3.5–5.1)
Sodium: 139 mmol/L (ref 135–145)
Total Bilirubin: 1.2 mg/dL (ref 0.3–1.2)
Total Protein: 7.4 g/dL (ref 6.5–8.1)

## 2022-11-03 LAB — I-STAT CHEM 8, ED
BUN: 23 mg/dL — ABNORMAL HIGH (ref 6–20)
Calcium, Ion: 1.07 mmol/L — ABNORMAL LOW (ref 1.15–1.40)
Chloride: 106 mmol/L (ref 98–111)
Creatinine, Ser: 0.8 mg/dL (ref 0.61–1.24)
Glucose, Bld: 86 mg/dL (ref 70–99)
HCT: 44 % (ref 39.0–52.0)
Hemoglobin: 15 g/dL (ref 13.0–17.0)
Potassium: 3.7 mmol/L (ref 3.5–5.1)
Sodium: 140 mmol/L (ref 135–145)
TCO2: 23 mmol/L (ref 22–32)

## 2022-11-03 LAB — PROTIME-INR
INR: 2.4 — ABNORMAL HIGH (ref 0.8–1.2)
Prothrombin Time: 25.9 seconds — ABNORMAL HIGH (ref 11.4–15.2)

## 2022-11-03 LAB — LACTIC ACID, PLASMA: Lactic Acid, Venous: 1 mmol/L (ref 0.5–1.9)

## 2022-11-03 LAB — CBG MONITORING, ED: Glucose-Capillary: 94 mg/dL (ref 70–99)

## 2022-11-03 MED ORDER — ONDANSETRON HCL 4 MG PO TABS: 4.0000 mg | ORAL_TABLET | Freq: Four times a day (QID) | ORAL | Status: DC | PRN

## 2022-11-03 MED ORDER — HYDROCODONE-ACETAMINOPHEN 5-325 MG PO TABS
1.0000 | ORAL_TABLET | Freq: Four times a day (QID) | ORAL | Status: DC | PRN
  Administered 2022-11-04: 1 via ORAL
  Filled 2022-11-03: qty 1

## 2022-11-03 MED ORDER — TETANUS-DIPHTH-ACELL PERTUSSIS 5-2.5-18.5 LF-MCG/0.5 IM SUSY
0.5000 mL | PREFILLED_SYRINGE | Freq: Once | INTRAMUSCULAR | Status: AC
  Administered 2022-11-03: 0.5 mL via INTRAMUSCULAR
  Filled 2022-11-03: qty 0.5

## 2022-11-03 MED ORDER — ONDANSETRON HCL 4 MG/2ML IJ SOLN: 4.0000 mg | Freq: Four times a day (QID) | INTRAMUSCULAR | Status: DC | PRN

## 2022-11-03 MED ORDER — HYDRALAZINE HCL 25 MG PO TABS
25.0000 mg | ORAL_TABLET | Freq: Two times a day (BID) | ORAL | Status: DC
  Administered 2022-11-03 – 2022-11-06 (×6): 25 mg via ORAL
  Filled 2022-11-03 (×6): qty 1

## 2022-11-03 MED ORDER — LIDOCAINE-EPINEPHRINE (PF) 2 %-1:200000 IJ SOLN
10.0000 mL | Freq: Once | INTRAMUSCULAR | Status: AC
  Administered 2022-11-03: 10 mL via INTRADERMAL
  Filled 2022-11-03: qty 20

## 2022-11-03 MED ORDER — PIPERACILLIN-TAZOBACTAM 3.375 G IVPB
3.3750 g | Freq: Three times a day (TID) | INTRAVENOUS | Status: DC
  Administered 2022-11-03 – 2022-11-06 (×9): 3.375 g via INTRAVENOUS
  Filled 2022-11-03 (×9): qty 50

## 2022-11-03 MED ORDER — HYDRALAZINE HCL 20 MG/ML IJ SOLN: 10.0000 mg | INTRAMUSCULAR | Status: DC | PRN

## 2022-11-03 MED ORDER — SODIUM CHLORIDE 0.9% FLUSH
3.0000 mL | Freq: Two times a day (BID) | INTRAVENOUS | Status: DC
  Administered 2022-11-04 – 2022-11-06 (×5): 3 mL via INTRAVENOUS

## 2022-11-03 MED ORDER — IOHEXOL 350 MG/ML SOLN
75.0000 mL | Freq: Once | INTRAVENOUS | Status: AC | PRN
  Administered 2022-11-03: 75 mL via INTRAVENOUS

## 2022-11-03 MED ORDER — HYDROCHLOROTHIAZIDE 25 MG PO TABS
25.0000 mg | ORAL_TABLET | Freq: Every day | ORAL | Status: DC
  Administered 2022-11-03 – 2022-11-06 (×4): 25 mg via ORAL
  Filled 2022-11-03 (×4): qty 1

## 2022-11-03 MED ORDER — LISINOPRIL 20 MG PO TABS
40.0000 mg | ORAL_TABLET | Freq: Every day | ORAL | Status: DC
  Administered 2022-11-03 – 2022-11-06 (×4): 40 mg via ORAL
  Filled 2022-11-03 (×4): qty 2

## 2022-11-03 MED ORDER — ALLOPURINOL 100 MG PO TABS
100.0000 mg | ORAL_TABLET | Freq: Every day | ORAL | Status: DC
  Administered 2022-11-03 – 2022-11-06 (×4): 100 mg via ORAL
  Filled 2022-11-03 (×4): qty 1

## 2022-11-03 MED ORDER — ACETAMINOPHEN 325 MG PO TABS: 650.0000 mg | ORAL_TABLET | Freq: Four times a day (QID) | ORAL | Status: DC | PRN

## 2022-11-03 MED ORDER — ACETAMINOPHEN 650 MG RE SUPP: 650.0000 mg | Freq: Four times a day (QID) | RECTAL | Status: DC | PRN

## 2022-11-03 MED ORDER — ALBUTEROL SULFATE (2.5 MG/3ML) 0.083% IN NEBU: 2.5000 mg | INHALATION_SOLUTION | Freq: Four times a day (QID) | RESPIRATORY_TRACT | Status: DC | PRN

## 2022-11-03 NOTE — Progress Notes (Signed)
ANTICOAGULATION CONSULT NOTE - Initial Consult  Pharmacy Consult for lovenox Indication: Hx PE/CVT  Allergies  Allergen Reactions   Clindamycin/Lincomycin Diarrhea and Nausea Only    Pt reports 8-10 years ago had bad nausea and diarrhea. Says this is the only antibiotic he's had a problem with.    Plasma Protein Fraction Other (See Comments)    Patient Measurements:    Vital Signs: Temp: 98.7 F (37.1 C) (01/15 1643) Temp Source: Oral (01/15 1643) BP: 149/83 (01/15 1400) Pulse Rate: 79 (01/15 1400)  Labs: Recent Labs    11/03/22 1045 11/03/22 1126 11/03/22 1138  HGB 14.5  --  15.0  HCT 45.1  --  44.0  PLT 223  --   --   LABPROT  --  25.9*  --   INR  --  2.4*  --   CREATININE 0.84  --  0.80    CrCl cannot be calculated (Unknown ideal weight.).   Medical History: Past Medical History:  Diagnosis Date   Clotting disorder ([place])    GERD (gastroesophageal reflux disease)    Hypertension    Lumbar disc disease    Morbid obesity ([place])    MRSA infection 2005   facial   OSA (obstructive sleep apnea)    Pulmonary embolus ([place]) 2008   recurrent    Assessment: 19 YOM presenting with inguinal abscess, hx of PE on warfarin PTA, on hold for possible surgical procedure.  INR on admission is therapeutic at 2.4  Goal of Therapy:  Anti-Xa level 0.6-1 units/ml 4hrs after LMWH dose given Monitor platelets by anticoagulation protocol: Yes   Plan:  INR in AM, lovenox start when <2 Daily INR, CBC, s/s bleeding  Bertis Ruddy, PharmD, [facility] Clinical Pharmacist ED Pharmacist Phone # [phone] 11/03/2022 5:35 PM

## 2022-11-03 NOTE — ED Triage Notes (Signed)
Patient here for evaluation of an abscess in his right groin that he first noticed around the middle of last week. Patient is alert, oriented, ambulating independently with steady gait, and is in no apparent distress at this time.

## 2022-11-03 NOTE — Consult Note (Signed)
Consult Note  [name] 05-Oct-1966  [id number].    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 Last dose of Coumadin was last night. States that a small boil first appeared in this area about 5 days ago. In the last 2 days it has gotten larger and more painful.He tried some "Boil-Eaze" at home without any relief. There has been some active purulent drainage since being in the ED. Reports some subjective fever but no temp measured greater than 99 at home. States that he has had abscesses in the past but never to this extent, and he has never required I&D. In the ED patient is afebrile and hemodynamically stable. WBC 12.8, lactic acid 1, INR 2.4. CT scan shows inflammatory stranding, ill-defined areas of fluid and air noted along the right inguinal canal extending down towards the scrotum with skin thickening; small either early abscess or phlegmonous process developing measures about 3.3x3.2x1.7 cm. General surgery asked to see.  ROS: Negative other than HPI  Family History  Problem Relation Age of Onset   Hypertension Mother    Pulmonary embolism Father    Diabetes Father    Brain cancer Sister     Past Medical History:  Diagnosis Date   Clotting disorder ([place])    GERD (gastroesophageal reflux disease)    Hypertension    Lumbar disc disease    Morbid obesity ([place])    MRSA infection 2005   facial   OSA (obstructive sleep apnea)    Pulmonary embolus ([place]) 2008   recurrent    Past Surgical History:  Procedure Laterality Date   VENA CAVA FILTER PLACEMENT  2008    Social History:  reports that he quit smoking about 7 years ago. His smoking use included cigarettes. He smoked an average of .25 packs per day. He has never used smokeless  tobacco. No history on file for alcohol use and drug use.  Allergies:  Allergies  Allergen Reactions   Plasma Protein Fraction Other (See Comments)    (Not in a hospital admission)   Blood pressure (!) 144/81, pulse 73, temperature 98.8 F (37.1 C), temperature source Oral, resp. rate 16, SpO2 99 %. Physical Exam:  General: pleasant, WD, obese male who is laying in bed in NAD Heart: regular, rate, and rhythm.   Lungs: Respiratory effort nonlabored Abd: soft, NT, ND, +BS, no masses, hernias, or organomegaly GU: erythema and induration of right mons pubis, small opening with purulent fluid spontaneously draining, does not appear to involve right testicle or penis, no crepitus  Psych: A&Ox3 with an appropriate affect.   Results for orders placed or performed during the hospital encounter of 11/03/22 (from the past 48 hour(s))  CBC with Differential     Status: Abnormal   Collection Time: 11/03/22 10:45 AM  Result Value Ref Range   WBC 12.8 (H) 4.0 - 10.5 K/uL   RBC 5.07 4.22 - 5.81 MIL/uL   Hemoglobin 14.5 13.0 - 17.0 g/dL   HCT 45.1 39.0 - 52.0 %   MCV 89.0 80.0 - 100.0 fL   MCH 28.6 26.0 - 34.0 pg   MCHC 32.2 30.0 - 36.0 g/dL   RDW 15.3 11.5 - 15.5 %   Platelets 223 150 - 400 K/uL   nRBC  0.0 0.0 - 0.2 %   Neutrophils Relative % 76 %   Neutro Abs 9.7 (H) 1.7 - 7.7 K/uL   Lymphocytes Relative 12 %   Lymphs Abs 1.6 0.7 - 4.0 K/uL   Monocytes Relative 9 %   Monocytes Absolute 1.1 (H) 0.1 - 1.0 K/uL   Eosinophils Relative 2 %   Eosinophils Absolute 0.3 0.0 - 0.5 K/uL   Basophils Relative 1 %   Basophils Absolute 0.1 0.0 - 0.1 K/uL   Immature Granulocytes 0 %   Abs Immature Granulocytes 0.03 0.00 - 0.07 K/uL    Comment: Performed at [facility], [address], [place], [place] [address]  Comprehensive metabolic panel     Status: Abnormal   Collection Time: 11/03/22 10:45 AM  Result Value Ref Range   Sodium 139 135 - 145 mmol/L   Potassium 3.6 3.5 - 5.1 mmol/L    Chloride 106 98 - 111 mmol/L   CO2 26 22 - 32 mmol/L   Glucose, Bld 92 70 - 99 mg/dL    Comment: Glucose reference range applies only to samples taken after fasting for at least 8 hours.   BUN 20 6 - 20 mg/dL   Creatinine, Ser 2.59 0.61 - 1.24 mg/dL   Calcium 9.0 8.9 - 56.3 mg/dL   Total Protein 7.4 6.5 - 8.1 g/dL   Albumin 3.4 (L) 3.5 - 5.0 g/dL   AST 25 15 - 41 U/L   ALT 27 0 - 44 U/L   Alkaline Phosphatase 37 (L) 38 - 126 U/L   Total Bilirubin 1.2 0.3 - 1.2 mg/dL   GFR, Estimated >87 >56 mL/min    Comment: (NOTE) Calculated using the CKD-EPI Creatinine Equation (2021)    Anion gap 7 5 - 15    Comment: Performed at [facility], [address], [place], [place] [address]  Lactic acid, plasma     Status: None   Collection Time: 11/03/22 11:06 AM  Result Value Ref Range   Lactic Acid, Venous 1.0 0.5 - 1.9 mmol/L    Comment: Performed at [facility], [address], [place], [place] [address]  POC CBG, ED     Status: None   Collection Time: 11/03/22 11:07 AM  Result Value Ref Range   Glucose-Capillary 94 70 - 99 mg/dL    Comment: Glucose reference range applies only to samples taken after fasting for at least 8 hours.  Protime-INR     Status: Abnormal   Collection Time: 11/03/22 11:26 AM  Result Value Ref Range   Prothrombin Time 25.9 (H) 11.4 - 15.2 seconds   INR 2.4 (H) 0.8 - 1.2    Comment: (NOTE) INR goal varies based on device and disease states. Performed at [facility], [address], [place], [place] [address]   I-stat chem 8, ED (not at [facility], [facility] or [facility])     Status: Abnormal   Collection Time: 11/03/22 11:38 AM  Result Value Ref Range   Sodium 140 135 - 145 mmol/L   Potassium 3.7 3.5 - 5.1 mmol/L   Chloride 106 98 - 111 mmol/L   BUN 23 (H) 6 - 20 mg/dL   Creatinine, Ser 3.01 0.61 - 1.24 mg/dL   Glucose, Bld 86 70 - 99 mg/dL    Comment: Glucose reference range applies only to samples taken after fasting for at least 8 hours.   Calcium, Ion  1.07 (L) 1.15 - 1.40 mmol/L   TCO2 23  22 - 32 mmol/L   Hemoglobin 15.0 13.0 - 17.0 g/dL   HCT 44.0 39.0 - 52.0 %   No results found.    Assessment/Plan Small right inguinal abscess - Abscess is small and spontaneously draining. Given that this abscess is already draining some, and he is on coumadin with an INR or 2.4, would not recommend I&D at this time. Recommend medical admission for IV antibiotics. Warm compresses to continue to encourage drainage. We will follow.  ID - [id number]  FEN - ok to have a diet from surgery standpoint VTE - SCDs, hold coumadin Admit to Medicine    I reviewed ED provider notes, last 24 h vitals and pain scores, last 48 h intake and output, last 24 h labs and trends, and last 24 h imaging results.  Norm Parcel, [facility] 11/03/2022, 12:33 PM Please see Amion for pager number during day hours 7:00am-4:30pm

## 2022-11-03 NOTE — ED Notes (Signed)
Lab stated they will add on PT-INR

## 2022-11-03 NOTE — ED Notes (Signed)
Patient transported to CT 

## 2022-11-03 NOTE — ED Provider Notes (Signed)
[place] EMERGENCY DEPARTMENT Provider Note   CSN: [id number] Arrival date & time: 11/03/22  0745     History  Chief Complaint  Patient presents with   Abscess    [name] is a 57 y.o. male.  The history is provided by the patient and medical records. No language interpreter was used.  Abscess    57 year-old male with history of HTN and possible T2DM presenting today with possible abscess in right groin area x5 days. First appeared five days ago as small boil in right groin but in last two days has grown in size and become painful. Pt reports a history of boils in the past but never to this extent. He denies any fever or recent illness. He tried using Boilease with little improvement.                                                                                                                                                                                                              Home Medications Prior to Admission medications   Medication Sig Start Date End Date Taking? Authorizing Provider  allopurinol (ZYLOPRIM) 100 MG tablet Take 100 mg by mouth daily. 11/05/18   [provider]  cephALEXin (KEFLEX) 500 MG capsule Take 1 capsule (500 mg total) by mouth 4 (four) times daily. Patient not taking: Reported on 01/24/2019 05/07/15   Araceli Bouche, PA  hydrALAZINE (APRESOLINE) 10 MG tablet Take 10 mg by mouth 2 (two) times daily.     [provider]  hydrochlorothiazide (HYDRODIURIL) 25 MG tablet Take 25 mg by mouth daily.    [provider]  lisinopril (PRINIVIL,ZESTRIL) 40 MG tablet Take 40 mg by mouth daily.    [provider]  triamcinolone cream (KENALOG) 0.1 % Apply 1 application topically 2 (two) times daily. Patient not taking: Reported on 01/24/2019 05/07/15   Araceli Bouche, PA  warfarin (COUMADIN) 5 MG tablet Take 5 mg by mouth 2 (two) times daily.     [provider]      Allergies    Plasma  protein fraction    Review of Systems   Review of Systems  All other systems reviewed and are negative.   Physical Exam Updated Vital Signs BP (!) 144/84   Pulse 75   Temp 97.7 F (36.5 C) (Oral)   Resp 18   SpO2 97%  Physical Exam Vitals and nursing note reviewed.  Constitutional:      General: He is not in acute distress.  Appearance: He is well-developed. He is obese.  HENT:     Head: Atraumatic.  Eyes:     Conjunctiva/sclera: Conjunctivae normal.  Abdominal:     Palpations: Abdomen is soft.  Genitourinary:    Comments: Chaperone present during exam.  An area of induration with fluctuance and surrounding erythema approximatelyl 10cm in diameter noted to R superior scrotal wall with ttp.  Otherwise scrotum is soft and no evidence of Fournier Gangrene Musculoskeletal:     Cervical back: Neck supple.  Skin:    Findings: No rash.  Neurological:     Mental Status: He is alert.     ED Results / Procedures / Treatments   Labs (all labs ordered are listed, but only abnormal results are displayed) Labs Reviewed  CBC WITH DIFFERENTIAL/PLATELET - Abnormal; Notable for the following components:      Result Value   WBC 12.8 (*)    Neutro Abs 9.7 (*)    Monocytes Absolute 1.1 (*)    All other components within normal limits  COMPREHENSIVE METABOLIC PANEL - Abnormal; Notable for the following components:   Albumin 3.4 (*)    Alkaline Phosphatase 37 (*)    All other components within normal limits  PROTIME-INR - Abnormal; Notable for the following components:   Prothrombin Time 25.9 (*)    INR 2.4 (*)    All other components within normal limits  I-STAT CHEM 8, ED - Abnormal; Notable for the following components:   BUN 23 (*)    Calcium, Ion 1.07 (*)    All other components within normal limits  CULTURE, BLOOD (ROUTINE X 2)  CULTURE, BLOOD (ROUTINE X 2)  LACTIC ACID, PLASMA  CBG MONITORING, ED    EKG None  Radiology CT ABDOMEN PELVIS W CONTRAST  Result  Date: 11/03/2022 CLINICAL DATA:  Hernia suspected.  Pain. EXAM: CT ABDOMEN AND PELVIS WITH CONTRAST TECHNIQUE: Multidetector CT imaging of the abdomen and pelvis was performed using the standard protocol following bolus administration of intravenous contrast. RADIATION DOSE REDUCTION: This exam was performed according to the departmental dose-optimization program which includes automated exposure control, adjustment of the mA and/or kV according to patient size and/or use of iterative reconstruction technique. CONTRAST:  70mL OMNIPAQUE IOHEXOL 350 MG/ML SOLN COMPARISON:  CT 2015 feb FINDINGS: Lower chest: There is some breathing motion at the lung bases. Slight basilar scar or atelectasis. No pleural effusion. Hepatobiliary: The liver has some tiny low-attenuation lesions, too small to characterize likely small cysts. Example segment 8 measures 8 mm. Tiny focus in segment 7. These were not clearly seen in 2015. Patent portal vein. Gallbladder is nondilated. Pancreas: Mild global fatty infiltration of the pancreas without obvious mass lesion. Spleen: Spleen is nonenlarged. Adrenals/Urinary Tract: Both adrenal glands has some nodular thickening diffusely inner unchanged in appearance from previous examination. Probable benign hypertrophy. No collecting system dilatation of either kidney. There are some tiny cystic foci in each kidney. Many areas are simply too small to completely characterize. The largest is seen anteriorly on left and measures 13 mm in transverse dimension and has Hounsfield unit of 0, Bosniak 1 cysts. No specific imaging follow-up recommended. Preserved contours of the urinary bladder which is underdistended. Stomach/Bowel: Large bowel has a normal course and caliber with scattered stool. Left-sided colonic diverticula. Normal retrocecal appendix. The stomach and small bowel are nondilated. No free air or free fluid. Vascular/Lymphatic: Normal caliber aorta and IVC. Scattered atherosclerotic plaque.  IVC filter in place. The tines of the filter extend along  the outside edge of the margins of the IVC in some locations, nonspecific.No developing abnormal lymph node enlargement seen in the abdomen. There are some enlarged nodes however in the pelvis. Example left pelvic sidewall on image 75 of series 3 measures 2.1 x 1.1 cm. Right-sided focus along the external iliac chain measures 2.8 by 1.1 cm. Few small nodes at the aortic bifurcation as well. Reproductive: Prostate is unremarkable. In addition there is significant inflammatory stranding along the right inguinal region extending towards the margin of the scrotum with skin thickening and ill-defined fluid there is also some small pockets of loculated fluid with air such as axial images 109 through 113 this complex area of fluid would measure cephalocaudal length of 3.3 cm and axial plane 3.2 by 1.7 cm. Other: No abdominal wall hernia or abnormality. No abdominopelvic ascites. Musculoskeletal: Scattered degenerative changes along the spine and pelvis. IMPRESSION: Inflammatory stranding, ill-defined areas of fluid and air noted along the right inguinal canal extending down towards the scrotum with skin thickening. Small either early abscess or phlegmonous process area developing. With a gas recommend close follow up to exclude an aggressive soft tissue infection. There are several mildly enlarged pelvic nodes. Based on the inguinal findings these could be reactive however would recommend follow-up to confirm resolution and provide surveillance. IVC filter. Few colonic diverticula. Critical Value/emergent results were called by telephone at the time of interpretation on 11/03/2022 at 12:35 pm to provider [facility] , who verbally acknowledged these results. Electronically Signed   By: Karen Kays M.D.   On: 11/03/2022 12:46    Procedures Procedures    Medications Ordered in ED Medications  piperacillin-tazobactam (ZOSYN) IVPB 3.375 g (has no  administration in time range)  Tdap (BOOSTRIX) injection 0.5 mL (0.5 mLs Intramuscular Given 11/03/22 0956)  lidocaine-EPINEPHrine (XYLOCAINE W/EPI) 2 %-1:200000 (PF) injection 10 mL (10 mLs Intradermal Given 11/03/22 0956)  iohexol (OMNIPAQUE) 350 MG/ML injection 75 mL (75 mLs Intravenous Contrast Given 11/03/22 1214)    ED Course/ Medical Decision Making/ A&P                             Medical Decision Making Amount and/or Complexity of Data Reviewed Labs: ordered. Radiology: ordered.  Risk Prescription drug management.   BP (!) 144/84   Pulse 75   Temp 97.7 F (36.5 C) (Oral)   Resp 18   SpO2 97%   29:36 AM 57 year old male who previously diagnosed with diabetes but now seems to be under better management presenting with complaints of concerns for abscess.  Patient noticed an area of swelling and tenderness to his right groin region and scrotal region ongoing for the past 5 days.  For the past 2 days it has increased in size and become more tender and oozing out some purulent discharge.  Symptoms felt similar to prior abscess that he has had in the past that usually ruptured on their own without I&D.  He does not endorse any fever or chills no trouble urinating no trouble with bowel bladder movement.  He denies any recent trauma.  He is unsure his last tetanus status.  He has tried Optometrist for relief. Patient report last year his A1c was 6.5 but after taking metformin, dietary changes and losing approximately 85 pounds of weight, his most recent A1c was 5.5  On exam this is an obese male laying in bed appears to be in no acute discomfort.  Examining his scrotum noted  for an area of induration with fluctuance around skin erythema oozing out purulent discharge and measuring approximately 10 cm in diameter.  Scrotum otherwise soft and no evidence concerning for Fournier's gangrene.  Patient overall well-appearing.  Will update his tetanus and we will perform incision and drainage for better  management of this abscess.  10:58 AM After further discussion with attending Dr. Dalene Seltzer who has also seen evaluate the abscess, we felt patient will be better in manage by general surgery as this abscess is large in size and may not be adequately incised and drained in the ED at bedside.  I appreciate consultation from general surgery team and spoke with Marisue Ivan who agrees to see and evaluate patient and will offer further recommendation.  -Labs ordered, independently viewed and interpreted by me.  Labs remarkable for INR 2.4.  pt is currently on Coumadin for hx of PE.  WBC 12.8 with normal lactic acid -The patient was maintained on a cardiac monitor.  I personally viewed and interpreted the cardiac monitored which showed an underlying rhythm of: NSR -Imaging independently viewed and interpreted by me and I agree with radiologist's interpretation.  Result remarkable for abd/pelvis CT showing inflammatory stranding aling right inguinal canal extending toward the scrotum sugggestive of early abscess or phlegmon.  Gas were noted.  This is likely due to spontaneous ruptured of abscess and less likely Fournier's gangrene.  PT has IVC filter -This patient presents to the ED for concern of crotal swelling, this involves an extensive number of treatment options, and is a complaint that carries with it a high risk of complications and morbidity.  The differential diagnosis includes abscess, cellulitis, hernia -Co morbidities that complicate the patient evaluation includes diabetes -Treatment includes vancomycin, opiate pain medication -Reevaluation of the patient after these medicines showed that the patient improved -PCP office notes or outside notes reviewed -Discussion with specialist gen surgery team who request medicine admission -Escalation to admission/observation considered: patients comfortable with admission for observation  2:14 PM CT scan demonstrates a right inguinal abscess extending towards  the scrotum.  There is an early forming abscess/phlegmon.  There are some gas and noted but this is likely due to the spontaneous rupture of the abscess and less likely to be Fournier gangrene.  Surgery has seen evaluate patient recommend hospital admission for IV antibiotics and close monitoring.  Since patient is on Coumadin for history of blood clotting disorder, he is not a candidate for incision and drainage at this time according to surgical team.  On reassessment, patient appears more comfortable.  2:34 PM Appreciate consultation from [facility] Dr. Katrinka Blazing who agrees to see and will admit pt for obsevation, IV abx.        Final Clinical Impression(s) / ED Diagnoses Final diagnoses:  Soft tissue abscess of inguinal region    Rx / DC Orders ED Discharge Orders     None         Fayrene Helper, PA-C 11/03/22 1434    Alvira Monday, MD 11/04/22 1137

## 2022-11-03 NOTE — H&P (Signed)
History and Physical    Patient: [name] VHQ:[id number] DOB: 1966-05-14 DOA: 11/03/2022 DOS: the patient was seen and examined on 11/03/2022 PCP: Aletha Halim., PA-C  Patient coming from: Home  Chief Complaint:  Chief Complaint  Patient presents with   Abscess   HPI: [name] is a 57 y.o. male with medical history significant of diabetes mellitus type 2, history of pulmonary embolism, and morbid obesity who presented with complaints of abscess of the right side of his groin.  Initially it started out as a pimple and progressively worsened over the last 5 days.  He reports that he gets boils in different places every 2 to 3 years, but they usually rupture and drain on their own.  Over the last 2 days he reported that the area had gotten bigger and more painful.  He had tried Boilease without improvement in symptoms.  At home he had chills, but temperature was just 99 F when checked.  Denies having any other symptoms.  He has been on anticoagulation ever since having a pulmonary embolism back in 02/2007.  His last dose of Coumadin was yesterday evening.  After his initial presentation 2 weeks later he was found to have concern for new clots for which an IVC filter ended up being placed.  He states he was never tested for any kind of clotting disorder, but his dad also had a history of blood clots.  Patient also reports that he had lost approximately 85 pounds over the last year that his last hemoglobin A1c was 5.5.  In the emergency department patient was noted to be afebrile with vital signs otherwise maintained.  Labs significant for WBC 12.8, BUN 20, creatinine 0.84, INR 2.4, and lactic acid 1.  CT scan noted concern for inflammatory stranding with ill-defined areas of fluid and air along the right inguinal canal concerning for either small early abscess or phlegmon process.  Patient had been given Tdap booster and started on Zosyn IV.  Surgery had been consulted and recommended  continued medical management with IV antibiotics and warm compresses as the abscesses started draining.   Review of Systems: As mentioned in the history of present illness. All other systems reviewed and are negative. Past Medical History:  Diagnosis Date   Clotting disorder ([place])    GERD (gastroesophageal reflux disease)    Hypertension    Lumbar disc disease    Morbid obesity ([place])    MRSA infection 2005   facial   OSA (obstructive sleep apnea)    Pulmonary embolus ([place]) 2008   recurrent   Past Surgical History:  Procedure Laterality Date   VENA CAVA FILTER PLACEMENT  2008   Social History:  reports that he quit smoking about 7 years ago. His smoking use included cigarettes. He smoked an average of .25 packs per day. He has never used smokeless tobacco. No history on file for alcohol use and drug use.  Allergies  Allergen Reactions   Plasma Protein Fraction Other (See Comments)    Family History  Problem Relation Age of Onset   Hypertension Mother    Pulmonary embolism Father    Diabetes Father    Brain cancer Sister     Prior to Admission medications   Medication Sig Start Date End Date Taking? Authorizing Provider  allopurinol (ZYLOPRIM) 100 MG tablet Take 100 mg by mouth daily. 11/05/18   [provider]  cephALEXin (KEFLEX) 500 MG capsule Take 1 capsule (500 mg total) by mouth 4 (  four) times daily. Patient not taking: Reported on 01/24/2019 05/07/15   Raelyn Ensign, PA  hydrALAZINE (APRESOLINE) 10 MG tablet Take 10 mg by mouth 2 (two) times daily.     [provider]  hydrochlorothiazide (HYDRODIURIL) 25 MG tablet Take 25 mg by mouth daily.    [provider]  lisinopril (PRINIVIL,ZESTRIL) 40 MG tablet Take 40 mg by mouth daily.    [provider]  triamcinolone cream (KENALOG) 0.1 % Apply 1 application topically 2 (two) times daily. Patient not taking: Reported on 01/24/2019 05/07/15   Raelyn Ensign, PA  warfarin (COUMADIN) 5 MG tablet  Take 5 mg by mouth 2 (two) times daily.     [provider]    Physical Exam: Vitals:   11/03/22 1200 11/03/22 1230 11/03/22 1300 11/03/22 1400  BP: (!) 144/81 136/82 (!) 148/84 (!) 149/83  Pulse: 73 66 67 79  Resp: 16  18 18   Temp:      TempSrc:      SpO2: 99% 98% 99% 96%    Constitutional: Morbidly obese middle-age male currently in no acute distress  Eyes: PERRL, lids and conjunctivae normal ENMT: Mucous membranes are moist. Posterior pharynx clear of any exudate or lesions.  Neck: normal, supple  Respiratory: Normal respiratory effort with decreased overall aeration.  No significant wheezes or rhonchi appreciated. Cardiovascular: Regular rate and rhythm, no murmurs / rubs / gallops. Abdomen: no tenderness, no masses palpated.   Bowel sounds positive.  Musculoskeletal: no clubbing / cyanosis. No joint deformity upper and lower extremities. Good ROM, no contractures. Normal muscle tone.  Skin: Swelling and erythema noted of the right inguinal region with open wound with foul-smelling drainage. Neurologic: CN 2-12 grossly intact.  Able to move all extremities Psychiatric: Normal judgment and insight. Alert and oriented x 3. Normal mood.   Data Reviewed:  Reviewed labs, imaging, and pertinent records as noted above in the HPI  Assessment and Plan: Right inguinal abscess Acute.  Patient presented with pain and swelling of the right groin.  CT abdomen and pelvis concern for inflammatory stranding with ill-defined areas of fluid and air along the right inguinal canal concerning for either small early abscess or phlegmon process.  Patient has been started on empiric antibiotics of Zosyn.  General surgery has been consulted and recommended warm compresses and continue antibiotics. -Admit to a medical telemetry bed -Continue warm compresses -Continue antibiotics of Zosyn -Hydrocodone as needed for pain -Appreciate general surgery consultative services, we will follow-up for  any further recommendations  Leukocytosis Acute.  WBC elevated at 12.8.  Suspect secondary to above. -Recheck CBC tomorrow morning  History of pulmonary embolism on chronic anticoagulation Status post IVC filter Patient with a history of pulmonary embolism back on 03/01/2007 and had been on anticoagulation, but returned on 5/21 and noted to have concern for new pulmonary emboli for which IVC filter has been placed.  Patient has been on anticoagulation since then INR appears therapeutic at 2.4.  General surgery consulted and recommended holding anticoagulation. -Hold Coumadin.  Resume when medically appropriate.  Controlled Diabetes mellitus type 2, without long-term use of insulin Patient reports last hemoglobin A1c was approximately 5.5 after he lost 85 pounds over the last year.  He reports being on metformin. -Check hemoglobin A1c in a.m. -Hold metformin -Carb modified diet  Essential hypertension -Continue home medication regimen  Morbid obesity Estimated at 56.3 kg/m based off last available weight -Orders placed to check weight  DVT prophylaxis: Lovenox Advance Care Planning:  Code Status: Full Code   Consults: General surgery  Family Communication: Family updated at bedside  Severity of Illness: The appropriate patient status for this patient is INPATIENT. Inpatient status is judged to be reasonable and necessary in order to provide the required intensity of service to ensure the patient's safety. The patient's presenting symptoms, physical exam findings, and initial radiographic and laboratory data in the context of their chronic comorbidities is felt to place them at high risk for further clinical deterioration. Furthermore, it is not anticipated that the patient will be medically stable for discharge from the hospital within 2 midnights of admission.   * I certify that at the point of admission it is my clinical judgment that the patient will require inpatient hospital  care spanning beyond 2 midnights from the point of admission due to high intensity of service, high risk for further deterioration and high frequency of surveillance required.*  Author: Norval Morton, MD 11/03/2022 2:28 PM  For on call review [email or web address].

## 2022-11-04 ENCOUNTER — Other Ambulatory Visit: Payer: Self-pay

## 2022-11-04 ENCOUNTER — Encounter (HOSPITAL_COMMUNITY): Payer: Self-pay | Admitting: Internal Medicine

## 2022-11-04 DIAGNOSIS — L0291 Cutaneous abscess, unspecified: Secondary | ICD-10-CM | POA: Diagnosis not present

## 2022-11-04 LAB — TSH: TSH: 2.227 u[IU]/mL (ref 0.350–4.500)

## 2022-11-04 LAB — CBC
HCT: 41.8 % (ref 39.0–52.0)
Hemoglobin: 14.2 g/dL (ref 13.0–17.0)
MCH: 29.3 pg (ref 26.0–34.0)
MCHC: 34 g/dL (ref 30.0–36.0)
MCV: 86.4 fL (ref 80.0–100.0)
Platelets: 247 10*3/uL (ref 150–400)
RBC: 4.84 MIL/uL (ref 4.22–5.81)
RDW: 15.1 % (ref 11.5–15.5)
WBC: 11.5 10*3/uL — ABNORMAL HIGH (ref 4.0–10.5)
nRBC: 0 % (ref 0.0–0.2)

## 2022-11-04 LAB — HEMOGLOBIN A1C
Hgb A1c MFr Bld: 5.4 % (ref 4.8–5.6)
Mean Plasma Glucose: 108.28 mg/dL

## 2022-11-04 LAB — PROTIME-INR
INR: 2.1 — ABNORMAL HIGH (ref 0.8–1.2)
Prothrombin Time: 23.7 seconds — ABNORMAL HIGH (ref 11.4–15.2)

## 2022-11-04 LAB — BASIC METABOLIC PANEL
Anion gap: 11 (ref 5–15)
BUN: 21 mg/dL — ABNORMAL HIGH (ref 6–20)
CO2: 23 mmol/L (ref 22–32)
Calcium: 9 mg/dL (ref 8.9–10.3)
Chloride: 104 mmol/L (ref 98–111)
Creatinine, Ser: 0.8 mg/dL (ref 0.61–1.24)
GFR, Estimated: >60 mL/min (ref >60)
Glucose, Bld: 103 mg/dL — ABNORMAL HIGH (ref 70–99)
Potassium: 3.6 mmol/L (ref 3.5–5.1)
Sodium: 138 mmol/L (ref 135–145)

## 2022-11-04 LAB — HIV ANTIBODY (ROUTINE TESTING W REFLEX): HIV Screen 4th Generation wRfx: NONREACTIVE

## 2022-11-04 LAB — MRSA NEXT GEN BY PCR, NASAL: MRSA by PCR Next Gen: NOT DETECTED

## 2022-11-04 MED ORDER — METOPROLOL TARTRATE 5 MG/5ML IV SOLN: 5.0000 mg | INTRAVENOUS | Status: DC | PRN

## 2022-11-04 MED ORDER — IPRATROPIUM-ALBUTEROL 0.5-2.5 (3) MG/3ML IN SOLN: 3.0000 mL | RESPIRATORY_TRACT | Status: DC | PRN

## 2022-11-04 MED ORDER — OXYCODONE HCL 5 MG PO TABS
5.0000 mg | ORAL_TABLET | ORAL | Status: DC | PRN
  Administered 2022-11-05 – 2022-11-06 (×3): 5 mg via ORAL
  Filled 2022-11-04 (×3): qty 1

## 2022-11-04 MED ORDER — TRAZODONE HCL 50 MG PO TABS: 50.0000 mg | ORAL_TABLET | Freq: Every evening | ORAL | Status: DC | PRN

## 2022-11-04 MED ORDER — GUAIFENESIN 100 MG/5ML PO LIQD: 5.0000 mL | ORAL | Status: DC | PRN

## 2022-11-04 MED ORDER — ONDANSETRON HCL 4 MG/2ML IJ SOLN: 4.0000 mg | Freq: Four times a day (QID) | INTRAMUSCULAR | Status: DC | PRN

## 2022-11-04 MED ORDER — HYDRALAZINE HCL 20 MG/ML IJ SOLN: 10.0000 mg | INTRAMUSCULAR | Status: DC | PRN

## 2022-11-04 NOTE — ED Notes (Signed)
ED TO INPATIENT HANDOFF REPORT  ED Nurse Name and Phone #: Eartha Inch RN [phone]  S Name/Age/Gender [name] 57 y.o. male Room/Bed: 005C/005C  Code Status   Code Status: Full Code  Home/SNF/Other Home Patient oriented to: self, place, time, and situation Is this baseline? Yes   Triage Complete: Triage complete  Chief Complaint Abscess [L02.91]  Triage Note Patient here for evaluation of an abscess in his right groin that he first noticed around the middle of last week. Patient is alert, oriented, ambulating independently with steady gait, and is in no apparent distress at this time.   Allergies Allergies  Allergen Reactions   Clindamycin/Lincomycin Diarrhea and Nausea Only    Pt reports 8-10 years ago had bad nausea and diarrhea. Says this is the only antibiotic he's had a problem with.    Plasma Protein Fraction Other (See Comments)    Level of Care/Admitting Diagnosis ED Disposition     ED Disposition  Admit   Condition  --   Comment  Hospital Area: [facility] [[id number]]  Level of Care: Telemetry Medical [104]  May admit patient to [facility] or [facility] if equivalent level of care is available:: No  Covid Evaluation: Asymptomatic - no recent exposure (last 10 days) testing not required  Diagnosis: Abscess [147829]  Admitting Physician: Norval Morton [[id number]]  Attending Physician: Norval Morton [[id number]]  Certification:: I certify this patient will need inpatient services for at least 2 midnights  Estimated Length of Stay: 3          B Medical/Surgery History Past Medical History:  Diagnosis Date   Clotting disorder ([place])    GERD (gastroesophageal reflux disease)    Hypertension    Lumbar disc disease    Morbid obesity ([place])    MRSA infection 2005   facial   OSA (obstructive sleep apnea)    Pulmonary embolus ([place]) 2008   recurrent   Past Surgical History:  Procedure Laterality Date   VENA CAVA FILTER PLACEMENT   2008     A IV Location/Drains/Wounds Patient Lines/Drains/Airways Status     Active Line/Drains/Airways     Name Placement date Placement time Site Days   Peripheral IV 11/03/22 20 G Anterior;Right Forearm 11/03/22  1110  Forearm  1            Intake/Output Last 24 hours  Intake/Output Summary (Last 24 hours) at 11/04/2022 1259 Last data filed at 11/04/2022 0834 Gross per 24 hour  Intake 53 ml  Output --  Net 53 ml    Labs/Imaging Results for orders placed or performed during the hospital encounter of 11/03/22 (from the past 48 hour(s))  CBC with Differential     Status: Abnormal   Collection Time: 11/03/22 10:45 AM  Result Value Ref Range   WBC 12.8 (H) 4.0 - 10.5 K/uL   RBC 5.07 4.22 - 5.81 MIL/uL   Hemoglobin 14.5 13.0 - 17.0 g/dL   HCT 45.1 39.0 - 52.0 %   MCV 89.0 80.0 - 100.0 fL   MCH 28.6 26.0 - 34.0 pg   MCHC 32.2 30.0 - 36.0 g/dL   RDW 15.3 11.5 - 15.5 %   Platelets 223 150 - 400 K/uL   nRBC 0.0 0.0 - 0.2 %   Neutrophils Relative % 76 %   Neutro Abs 9.7 (H) 1.7 - 7.7 K/uL   Lymphocytes Relative 12 %   Lymphs Abs 1.6 0.7 - 4.0 K/uL   Monocytes Relative 9 %  Monocytes Absolute 1.1 (H) 0.1 - 1.0 K/uL   Eosinophils Relative 2 %   Eosinophils Absolute 0.3 0.0 - 0.5 K/uL   Basophils Relative 1 %   Basophils Absolute 0.1 0.0 - 0.1 K/uL   Immature Granulocytes 0 %   Abs Immature Granulocytes 0.03 0.00 - 0.07 K/uL    Comment: Performed at [facility], [address] [place], [place] [address]  Comprehensive metabolic panel     Status: Abnormal   Collection Time: 11/03/22 10:45 AM  Result Value Ref Range   Sodium 139 135 - 145 mmol/L   Potassium 3.6 3.5 - 5.1 mmol/L   Chloride 106 98 - 111 mmol/L   CO2 26 22 - 32 mmol/L   Glucose, Bld 92 70 - 99 mg/dL    Comment: Glucose reference range applies only to samples taken after fasting for at least 8 hours.   BUN 20 6 - 20 mg/dL   Creatinine, Ser 6.040.84 0.61 - 1.24 mg/dL   Calcium 9.0 8.9 - 54.010.3 mg/dL    Total Protein 7.4 6.5 - 8.1 g/dL   Albumin 3.4 (L) 3.5 - 5.0 g/dL   AST 25 15 - 41 U/L   ALT 27 0 - 44 U/L   Alkaline Phosphatase 37 (L) 38 - 126 U/L   Total Bilirubin 1.2 0.3 - 1.2 mg/dL   GFR, Estimated >98>60 >11>60 mL/min    Comment: (NOTE) Calculated using the CKD-EPI Creatinine Equation (2021)    Anion gap 7 5 - 15    Comment: Performed at [facility], [address] [place], [place] [address]  Lactic acid, plasma     Status: None   Collection Time: 11/03/22 11:06 AM  Result Value Ref Range   Lactic Acid, Venous 1.0 0.5 - 1.9 mmol/L    Comment: Performed at [facility], [address] [place], [place] [address]  Blood culture (routine x 2)     Status: None (Preliminary result)   Collection Time: 11/03/22 11:06 AM   Specimen: BLOOD RIGHT FOREARM  Result Value Ref Range   Specimen Description BLOOD RIGHT FOREARM    Special Requests      BOTTLES DRAWN AEROBIC AND ANAEROBIC Blood Culture adequate volume   Culture      NO GROWTH < 24 HOURS Performed at [facility], [address] [place], [place] [address]    Report Status PENDING   POC CBG, ED     Status: None   Collection Time: 11/03/22 11:07 AM  Result Value Ref Range   Glucose-Capillary 94 70 - 99 mg/dL    Comment: Glucose reference range applies only to samples taken after fasting for at least 8 hours.  Protime-INR     Status: Abnormal   Collection Time: 11/03/22 11:26 AM  Result Value Ref Range   Prothrombin Time 25.9 (H) 11.4 - 15.2 seconds   INR 2.4 (H) 0.8 - 1.2    Comment: (NOTE) INR goal varies based on device and disease states. Performed at [facility], [address] [place], [place] [address]   I-stat chem 8, ED (not at [facility], [facility] or [facility])     Status: Abnormal   Collection Time: 11/03/22 11:38 AM  Result Value Ref Range   Sodium 140 135 - 145 mmol/L   Potassium 3.7 3.5 - 5.1 mmol/L   Chloride 106 98 - 111 mmol/L   BUN 23 (H) 6 - 20 mg/dL   Creatinine, Ser 9.620.80 0.61 -  1.24  mg/dL   Glucose, Bld 86 70 - 99 mg/dL    Comment: Glucose reference range applies only to samples taken after fasting for at least 8 hours.   Calcium, Ion 1.07 (L) 1.15 - 1.40 mmol/L   TCO2 23 22 - 32 mmol/L   Hemoglobin 15.0 13.0 - 17.0 g/dL   HCT 27.2 53.6 - 64.4 %  HIV Antibody (routine testing w rflx)     Status: None   Collection Time: 11/04/22  4:49 AM  Result Value Ref Range   HIV Screen 4th Generation wRfx Non Reactive Non Reactive    Comment: Performed at [facility], [address] [place], [place] [address]  CBC     Status: Abnormal   Collection Time: 11/04/22  4:49 AM  Result Value Ref Range   WBC 11.5 (H) 4.0 - 10.5 K/uL   RBC 4.84 4.22 - 5.81 MIL/uL   Hemoglobin 14.2 13.0 - 17.0 g/dL   HCT 25.9 56.3 - 87.5 %   MCV 86.4 80.0 - 100.0 fL   MCH 29.3 26.0 - 34.0 pg   MCHC 34.0 30.0 - 36.0 g/dL   RDW 64.3 32.9 - 51.8 %   Platelets 247 150 - 400 K/uL   nRBC 0.0 0.0 - 0.2 %    Comment: Performed at [facility], [address], [place], [place] [address]  Basic metabolic panel     Status: Abnormal   Collection Time: 11/04/22  4:49 AM  Result Value Ref Range   Sodium 138 135 - 145 mmol/L   Potassium 3.6 3.5 - 5.1 mmol/L   Chloride 104 98 - 111 mmol/L   CO2 23 22 - 32 mmol/L   Glucose, Bld 103 (H) 70 - 99 mg/dL    Comment: Glucose reference range applies only to samples taken after fasting for at least 8 hours.   BUN 21 (H) 6 - 20 mg/dL   Creatinine, Ser 0.63 0.61 - 1.24 mg/dL   Calcium 9.0 8.9 - 01.6 mg/dL   GFR, Estimated >01 >09 mL/min    Comment: (NOTE) Calculated using the CKD-EPI Creatinine Equation (2021)    Anion gap 11 5 - 15    Comment: Performed at [facility], [address], [place], [place] [address]  Hemoglobin A1c     Status: None   Collection Time: 11/04/22  4:49 AM  Result Value Ref Range   Hgb A1c MFr Bld 5.4 4.8 - 5.6 %    Comment: (NOTE) Pre diabetes:          5.7%-6.4%  Diabetes:              >6.4%  Glycemic control  for   <7.0% adults with diabetes    Mean Plasma Glucose 108.28 mg/dL    Comment: Performed at [facility], [address] [place], [place] [address]  Protime-INR     Status: Abnormal   Collection Time: 11/04/22  4:49 AM  Result Value Ref Range   Prothrombin Time 23.7 (H) 11.4 - 15.2 seconds   INR 2.1 (H) 0.8 - 1.2    Comment: (NOTE) INR goal varies based on device and disease states. Performed at [facility], [address] [place], [place] [address]   MRSA Next Gen by PCR, Nasal     Status: None   Collection Time: 11/04/22  8:25 AM   Specimen: Nasal Mucosa; Nasal Swab  Result Value Ref Range   MRSA by PCR  Next Gen NOT DETECTED NOT DETECTED    Comment: (NOTE) The GeneXpert MRSA Assay (FDA approved for NASAL specimens only), is one component of a comprehensive MRSA colonization surveillance program. It is not intended to diagnose MRSA infection nor to guide or monitor treatment for MRSA infections. Test performance is not FDA approved in patients less than 34 years old. Performed at [facility], [address], [place], [place] [address]   TSH     Status: None   Collection Time: 11/04/22  8:49 AM  Result Value Ref Range   TSH 2.227 0.350 - 4.500 uIU/mL    Comment: Performed by a 3rd Generation assay with a functional sensitivity of <=0.01 uIU/mL. Performed at [facility], [address], [place], [place] [address]    CT ABDOMEN PELVIS W CONTRAST  Result Date: 11/03/2022 CLINICAL DATA:  Hernia suspected.  Pain. EXAM: CT ABDOMEN AND PELVIS WITH CONTRAST TECHNIQUE: Multidetector CT imaging of the abdomen and pelvis was performed using the standard protocol following bolus administration of intravenous contrast. RADIATION DOSE REDUCTION: This exam was performed according to the departmental dose-optimization program which includes automated exposure control, adjustment of the mA and/or kV according to patient size and/or use of iterative reconstruction  technique. CONTRAST:  39mL OMNIPAQUE IOHEXOL 350 MG/ML SOLN COMPARISON:  CT 2015 feb FINDINGS: Lower chest: There is some breathing motion at the lung bases. Slight basilar scar or atelectasis. No pleural effusion. Hepatobiliary: The liver has some tiny low-attenuation lesions, too small to characterize likely small cysts. Example segment 8 measures 8 mm. Tiny focus in segment 7. These were not clearly seen in 2015. Patent portal vein. Gallbladder is nondilated. Pancreas: Mild global fatty infiltration of the pancreas without obvious mass lesion. Spleen: Spleen is nonenlarged. Adrenals/Urinary Tract: Both adrenal glands has some nodular thickening diffusely inner unchanged in appearance from previous examination. Probable benign hypertrophy. No collecting system dilatation of either kidney. There are some tiny cystic foci in each kidney. Many areas are simply too small to completely characterize. The largest is seen anteriorly on left and measures 13 mm in transverse dimension and has Hounsfield unit of 0, Bosniak 1 cysts. No specific imaging follow-up recommended. Preserved contours of the urinary bladder which is underdistended. Stomach/Bowel: Large bowel has a normal course and caliber with scattered stool. Left-sided colonic diverticula. Normal retrocecal appendix. The stomach and small bowel are nondilated. No free air or free fluid. Vascular/Lymphatic: Normal caliber aorta and IVC. Scattered atherosclerotic plaque. IVC filter in place. The tines of the filter extend along the outside edge of the margins of the IVC in some locations, nonspecific.No developing abnormal lymph node enlargement seen in the abdomen. There are some enlarged nodes however in the pelvis. Example left pelvic sidewall on image 75 of series 3 measures 2.1 x 1.1 cm. Right-sided focus along the external iliac chain measures 2.8 by 1.1 cm. Few small nodes at the aortic bifurcation as well. Reproductive: Prostate is unremarkable. In addition  there is significant inflammatory stranding along the right inguinal region extending towards the margin of the scrotum with skin thickening and ill-defined fluid there is also some small pockets of loculated fluid with air such as axial images 109 through 113 this complex area of fluid would measure cephalocaudal length of 3.3 cm and axial plane 3.2 by 1.7 cm. Other: No abdominal wall hernia or abnormality. No abdominopelvic ascites. Musculoskeletal: Scattered degenerative changes along the spine and pelvis. IMPRESSION: Inflammatory stranding, ill-defined areas of fluid and air noted  along the right inguinal canal extending down towards the scrotum with skin thickening. Small either early abscess or phlegmonous process area developing. With a gas recommend close follow up to exclude an aggressive soft tissue infection. There are several mildly enlarged pelvic nodes. Based on the inguinal findings these could be reactive however would recommend follow-up to confirm resolution and provide surveillance. IVC filter. Few colonic diverticula. Critical Value/emergent results were called by telephone at the time of interpretation on 11/03/2022 at 12:35 pm to provider [facility] , who verbally acknowledged these results. Electronically Signed   By: Karen Kays M.D.   On: 11/03/2022 12:46    Pending Labs Unresulted Labs (From admission, onward)     Start     Ordered   11/05/22 0500  Basic metabolic panel  Daily,   R      11/04/22 0811   11/05/22 0500  CBC  Daily,   R      11/04/22 0811   11/05/22 0500  Magnesium  Daily,   R      11/04/22 0811   11/04/22 0500  Protime-INR  Daily,   R      11/03/22 1736   11/03/22 1046  Blood culture (routine x 2)  BLOOD CULTURE X 2,   R (with STAT occurrences)      11/03/22 1045            Vitals/Pain Today's Vitals   11/04/22 1130 11/04/22 1210 11/04/22 1230 11/04/22 1252  BP:  136/78 130/83   Pulse: 64 64 63   Resp: 16 (!) 21 19   Temp:   98 F (36.7 C)    TempSrc:      SpO2: 95% 95% 95%   Weight:      Height:      PainSc:    0-No pain    Isolation Precautions No active isolations  Medications Medications  piperacillin-tazobactam (ZOSYN) IVPB 3.375 g (0 g Intravenous Stopped 11/04/22 0911)  sodium chloride flush (NS) 0.9 % injection 3 mL (3 mLs Intravenous Given 11/04/22 0834)  acetaminophen (TYLENOL) tablet 650 mg (has no administration in time range)    Or  acetaminophen (TYLENOL) suppository 650 mg (has no administration in time range)  ondansetron (ZOFRAN) tablet 4 mg (has no administration in time range)  HYDROcodone-acetaminophen (NORCO/VICODIN) 5-325 MG per tablet 1 tablet (1 tablet Oral Given 11/04/22 0511)  allopurinol (ZYLOPRIM) tablet 100 mg (100 mg Oral Given 11/04/22 0835)  hydrochlorothiazide (HYDRODIURIL) tablet 25 mg (25 mg Oral Given 11/04/22 0835)  lisinopril (ZESTRIL) tablet 40 mg (40 mg Oral Given 11/04/22 0835)  hydrALAZINE (APRESOLINE) tablet 25 mg (25 mg Oral Given 11/04/22 0834)  ipratropium-albuterol (DUONEB) 0.5-2.5 (3) MG/3ML nebulizer solution 3 mL (has no administration in time range)  hydrALAZINE (APRESOLINE) injection 10 mg (has no administration in time range)  metoprolol tartrate (LOPRESSOR) injection 5 mg (has no administration in time range)  oxyCODONE (Oxy IR/ROXICODONE) immediate release tablet 5 mg (has no administration in time range)  guaiFENesin (ROBITUSSIN) 100 MG/5ML liquid 5 mL (has no administration in time range)  traZODone (DESYREL) tablet 50 mg (has no administration in time range)  ondansetron (ZOFRAN) injection 4 mg (has no administration in time range)  Tdap (BOOSTRIX) injection 0.5 mL (0.5 mLs Intramuscular Given 11/03/22 0956)  lidocaine-EPINEPHrine (XYLOCAINE W/EPI) 2 %-1:200000 (PF) injection 10 mL (10 mLs Intradermal Given 11/03/22 0956)  iohexol (OMNIPAQUE) 350 MG/ML injection 75 mL (75 mLs Intravenous Contrast Given 11/03/22 1214)    Mobility walks  Focused Assessments Cardiac  Assessment Handoff:  Cardiac Rhythm: Normal sinus rhythm No results found for: "CKTOTAL", "CKMB", "CKMBINDEX", "TROPONINI" No results found for: "DDIMER" Does the Patient currently have chest pain? No    R Recommendations: See Admitting Provider Note  Report given to:   Additional Notes:   Pt to shower in IP unit to help promote drainage. Pt to apply warm compresses to promote drainage.

## 2022-11-04 NOTE — Progress Notes (Signed)
Progress Note     Subjective: Pt reports pain improving overall. Right groin continues to drain and is saturating 4x4 gauze every 3-4 hrs. Urinating without difficulty. Afebrile.   Objective: Vital signs in last 24 hours: Temp:  [98.2 F (36.8 C)-98.8 F (37.1 C)] 98.4 F (36.9 C) (01/16 0835) Pulse Rate:  [60-91] 67 (01/16 0830) Resp:  [15-19] 19 (01/16 0830) BP: (111-154)/(58-94) 126/94 (01/16 0830) SpO2:  [94 %-99 %] 94 % (01/16 0830) Weight:  [147.4 kg] 147.4 kg (01/16 0833) Last BM Date : 11/02/22  Intake/Output from previous day: 01/15 0701 - 01/16 0700 In: 72 [IV Piggyback:50] Out: -  Intake/Output this shift: Total I/O In: 3 [I.V.:3] Out: -   PE: General: pleasant, WD, obese male who is laying in bed in NAD Heart: regular, rate, and rhythm.   Lungs: Respiratory effort nonlabored Abd: soft, NT, ND, +BS, no masses, hernias, or organomegaly GU: erythema and induration of right mons pubis ~8 cm appears slightly improved from yesterday, small opening with purulent fluid spontaneously draining, does not appear to involve right testicle or penis, no crepitus  Psych: A&Ox3 with an appropriate affect.   Lab Results:  Recent Labs    11/03/22 1045 11/03/22 1138 11/04/22 0449  WBC 12.8*  --  11.5*  HGB 14.5 15.0 14.2  HCT 45.1 44.0 41.8  PLT 223  --  247   BMET Recent Labs    11/03/22 1045 11/03/22 1138 11/04/22 0449  NA 139 140 138  K 3.6 3.7 3.6  CL 106 106 104  CO2 26  --  23  GLUCOSE 92 86 103*  BUN 20 23* 21*  CREATININE 0.84 0.80 0.80  CALCIUM 9.0  --  9.0   PT/INR Recent Labs    11/03/22 1126 11/04/22 0449  LABPROT 25.9* 23.7*  INR 2.4* 2.1*   CMP     Component Value Date/Time   NA 138 11/04/2022 0449   K 3.6 11/04/2022 0449   CL 104 11/04/2022 0449   CO2 23 11/04/2022 0449   GLUCOSE 103 (H) 11/04/2022 0449   BUN 21 (H) 11/04/2022 0449   CREATININE 0.80 11/04/2022 0449   CALCIUM 9.0 11/04/2022 0449   PROT 7.4 11/03/2022 1045    ALBUMIN 3.4 (L) 11/03/2022 1045   AST 25 11/03/2022 1045   ALT 27 11/03/2022 1045   ALKPHOS 37 (L) 11/03/2022 1045   BILITOT 1.2 11/03/2022 1045   GFRNONAA >60 11/04/2022 0449   Lipase  No results found for: "LIPASE"     Studies/Results: CT ABDOMEN PELVIS W CONTRAST  Result Date: 11/03/2022 CLINICAL DATA:  Hernia suspected.  Pain. EXAM: CT ABDOMEN AND PELVIS WITH CONTRAST TECHNIQUE: Multidetector CT imaging of the abdomen and pelvis was performed using the standard protocol following bolus administration of intravenous contrast. RADIATION DOSE REDUCTION: This exam was performed according to the departmental dose-optimization program which includes automated exposure control, adjustment of the mA and/or kV according to patient size and/or use of iterative reconstruction technique. CONTRAST:  24mL OMNIPAQUE IOHEXOL 350 MG/ML SOLN COMPARISON:  CT 2015 feb FINDINGS: Lower chest: There is some breathing motion at the lung bases. Slight basilar scar or atelectasis. No pleural effusion. Hepatobiliary: The liver has some tiny low-attenuation lesions, too small to characterize likely small cysts. Example segment 8 measures 8 mm. Tiny focus in segment 7. These were not clearly seen in 2015. Patent portal vein. Gallbladder is nondilated. Pancreas: Mild global fatty infiltration of the pancreas without obvious mass lesion. Spleen: Spleen is nonenlarged.  Adrenals/Urinary Tract: Both adrenal glands has some nodular thickening diffusely inner unchanged in appearance from previous examination. Probable benign hypertrophy. No collecting system dilatation of either kidney. There are some tiny cystic foci in each kidney. Many areas are simply too small to completely characterize. The largest is seen anteriorly on left and measures 13 mm in transverse dimension and has Hounsfield unit of 0, Bosniak 1 cysts. No specific imaging follow-up recommended. Preserved contours of the urinary bladder which is underdistended.  Stomach/Bowel: Large bowel has a normal course and caliber with scattered stool. Left-sided colonic diverticula. Normal retrocecal appendix. The stomach and small bowel are nondilated. No free air or free fluid. Vascular/Lymphatic: Normal caliber aorta and IVC. Scattered atherosclerotic plaque. IVC filter in place. The tines of the filter extend along the outside edge of the margins of the IVC in some locations, nonspecific.No developing abnormal lymph node enlargement seen in the abdomen. There are some enlarged nodes however in the pelvis. Example left pelvic sidewall on image 75 of series 3 measures 2.1 x 1.1 cm. Right-sided focus along the external iliac chain measures 2.8 by 1.1 cm. Few small nodes at the aortic bifurcation as well. Reproductive: Prostate is unremarkable. In addition there is significant inflammatory stranding along the right inguinal region extending towards the margin of the scrotum with skin thickening and ill-defined fluid there is also some small pockets of loculated fluid with air such as axial images 109 through 113 this complex area of fluid would measure cephalocaudal length of 3.3 cm and axial plane 3.2 by 1.7 cm. Other: No abdominal wall hernia or abnormality. No abdominopelvic ascites. Musculoskeletal: Scattered degenerative changes along the spine and pelvis. IMPRESSION: Inflammatory stranding, ill-defined areas of fluid and air noted along the right inguinal canal extending down towards the scrotum with skin thickening. Small either early abscess or phlegmonous process area developing. With a gas recommend close follow up to exclude an aggressive soft tissue infection. There are several mildly enlarged pelvic nodes. Based on the inguinal findings these could be reactive however would recommend follow-up to confirm resolution and provide surveillance. IVC filter. Few colonic diverticula. Critical Value/emergent results were called by telephone at the time of interpretation on  11/03/2022 at 12:35 pm to provider [facility] , who verbally acknowledged these results. Electronically Signed   By: Karen Kays M.D.   On: 11/03/2022 12:46    Anti-infectives: Anti-infectives (From admission, onward)    Start     Dose/Rate Route Frequency Ordered Stop   11/03/22 1415  piperacillin-tazobactam (ZOSYN) IVPB 3.375 g        3.375 g 12.5 mL/hr over 240 Minutes Intravenous Every 8 hours 11/03/22 1409          Assessment/Plan Small right inguinal abscess - Abscess is small and continues to spontaneously drain  - WBC 11 from 12 and pt afebrile - warm compresses q4h - no indication for I&D today and INR 2.1 - MRSA swab today to make sure we don't need to broaden coverage - if cellulitis continues to improve with warm compresses and abx consider DC home on PO abx and close follow up tomorrow    ID - Zosyn 1/15>> FEN - ok to have a diet from surgery standpoint VTE - SCDs, hold coumadin, IVC filter in place   - below per TRH -  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     Norm Parcel, [facility] 11/04/2022, 9:19 AM Please see Amion for pager number during day hours 7:00am-4:30pm

## 2022-11-04 NOTE — ED Notes (Signed)
Surgeon at bedside.  

## 2022-11-04 NOTE — ED Notes (Signed)
Patient walked to restroom independently

## 2022-11-04 NOTE — Plan of Care (Signed)
  Problem: Education: Goal: Knowledge of General Education information will improve Description: Including pain rating scale, medication(s)/side effects and non-pharmacologic comfort measures Outcome: Progressing   Problem: Clinical Measurements: Goal: Will remain free from infection Outcome: Progressing

## 2022-11-04 NOTE — Progress Notes (Signed)
PROGRESS NOTE    [name]  OFB:[id number] DOB: 12/23/1965 DOA: 11/03/2022 PCP: Justin Campbell., PA-C   Brief Narrative:  56 year old with history of DM2, PE on Coumadin with IVC filter in place, morbid obesity comes to the hospital with right groin abscess which initially started as pimple and progressed over the last 5 days.  Reports of low-grade fever and subjective chills at home.  He was diagnosed with PE in 2008 and since then he has been on Coumadin.  CT scan showed concerns of inflammatory changes in the right inguinal area.  He was started on IV Zosyn, general surgery was consulted who recommended antibiotics and warm compresses.   Assessment & Plan:  Principal Problem:   Abscess Active Problems:   S/P IVC filter   History of pulmonary embolism   Leukocytosis   Chronic anticoagulation   Hypertension   Morbid obesity (HCC)   Right inguinal abscess Currently appears to be spontaneously draining.  CT scan reviewed.  General surgery recommending IV Zosyn and warm compresses.  Pain control. Photo posted in the chart   Leukocytosis Secondary to underlying infection.   History of pulmonary embolism on chronic anticoagulation Status post IVC filter Initially diagnosed in 2008 and since then he has been on Coumadin.  IVC filter was also placed around that time.  INR is therapeutic, pharmacy to manage.  INR today 2.1, once drops below 2.0-start Lovenox 1 mg/kg every 12 hours or heparin drip   Controlled Diabetes mellitus type 2, without long-term use of insulin Metformin currently on hold, hemoglobin A1c during this admission 5.4.  Sliding scale and Accu-Cheks.   Essential hypertension -Continue home medication regimen hydralazine, HCTZ and lisinopril.  IV as needed   Morbid obesity Estimated at 56.3 kg/m based off last available weight -Orders placed to check weight      DVT prophylaxis: On Coumadin Code Status: Full code Family Communication:    Status  is: Inpatient Continue hospital stay for IV antibiotics   Subjective: Patient seen and examined at bedside.  No complaints.  Tells me his drainage has slowed down a little bit this morning.  Continues to use warm compresses.   Examination:  General exam: Appears calm and comfortable  Respiratory system: Clear to auscultation. Respiratory effort normal. Cardiovascular system: S1 & S2 heard, RRR. No JVD, murmurs, rubs, gallops or clicks. No pedal edema. Gastrointestinal system: Abdomen is nondistended, soft and nontender. No organomegaly or masses felt. Normal bowel sounds heard. Central nervous system: Alert and oriented. No focal neurological deficits. Extremities: Symmetric 5 x 5 power. Skin: No rashes, lesions or ulcers.  Scrotal swelling noted. Psychiatry: Judgement and insight appear normal. Mood & affect appropriate.     Objective: Vitals:   11/03/22 1920 11/03/22 1930 11/03/22 2110 11/04/22 0305  BP:   (!) 111/58 (!) 151/88  Pulse: 89 91 73 68  Resp:   18 16  Temp:   98.5 F (36.9 C) 98.2 F (36.8 C)  TempSrc:   Oral   SpO2: 96% 96% 94% 96%    Intake/Output Summary (Last 24 hours) at 11/04/2022 0806 Last data filed at 11/04/2022 0150 Gross per 24 hour  Intake 50 ml  Output --  Net 50 ml   There were no vitals filed for this visit.   Data Reviewed:   CBC: Recent Labs  Lab 11/03/22 1045 11/03/22 1138 11/04/22 0449  WBC 12.8*  --  11.5*  NEUTROABS 9.7*  --   --   HGB 14.5 15.0 14.2  HCT 45.1 44.0 41.8  MCV 89.0  --  86.4  PLT 223  --  935   Basic Metabolic Panel: Recent Labs  Lab 11/03/22 1045 11/03/22 1138 11/04/22 0449  NA 139 140 138  K 3.6 3.7 3.6  CL 106 106 104  CO2 26  --  23  GLUCOSE 92 86 103*  BUN 20 23* 21*  CREATININE 0.84 0.80 0.80  CALCIUM 9.0  --  9.0   GFR: CrCl cannot be calculated (Unknown ideal weight.). Liver Function Tests: Recent Labs  Lab 11/03/22 1045  AST 25  ALT 27  ALKPHOS 37*  BILITOT 1.2  PROT 7.4   ALBUMIN 3.4*   No results for input(s): "LIPASE", "AMYLASE" in the last 168 hours. No results for input(s): "AMMONIA" in the last 168 hours. Coagulation Profile: Recent Labs  Lab 11/03/22 1126 11/04/22 0449  INR 2.4* 2.1*   Cardiac Enzymes: No results for input(s): "CKTOTAL", "CKMB", "CKMBINDEX", "TROPONINI" in the last 168 hours. BNP (last 3 results) No results for input(s): "PROBNP" in the last 8760 hours. HbA1C: Recent Labs    11/04/22 0449  HGBA1C 5.4   CBG: Recent Labs  Lab 11/03/22 1107  GLUCAP 94   Lipid Profile: No results for input(s): "CHOL", "HDL", "LDLCALC", "TRIG", "CHOLHDL", "LDLDIRECT" in the last 72 hours. Thyroid Function Tests: No results for input(s): "TSH", "T4TOTAL", "FREET4", "T3FREE", "THYROIDAB" in the last 72 hours. Anemia Panel: No results for input(s): "VITAMINB12", "FOLATE", "FERRITIN", "TIBC", "IRON", "RETICCTPCT" in the last 72 hours. Sepsis Labs: Recent Labs  Lab 11/03/22 1106  LATICACIDVEN 1.0    No results found for this or any previous visit (from the past 240 hour(s)).       Radiology Studies: CT ABDOMEN PELVIS W CONTRAST  Result Date: 11/03/2022 CLINICAL DATA:  Hernia suspected.  Pain. EXAM: CT ABDOMEN AND PELVIS WITH CONTRAST TECHNIQUE: Multidetector CT imaging of the abdomen and pelvis was performed using the standard protocol following bolus administration of intravenous contrast. RADIATION DOSE REDUCTION: This exam was performed according to the departmental dose-optimization program which includes automated exposure control, adjustment of the mA and/or kV according to patient size and/or use of iterative reconstruction technique. CONTRAST:  46mL OMNIPAQUE IOHEXOL 350 MG/ML SOLN COMPARISON:  CT 2015 feb FINDINGS: Lower chest: There is some breathing motion at the lung bases. Slight basilar scar or atelectasis. No pleural effusion. Hepatobiliary: The liver has some tiny low-attenuation lesions, too small to characterize likely  small cysts. Example segment 8 measures 8 mm. Tiny focus in segment 7. These were not clearly seen in 2015. Patent portal vein. Gallbladder is nondilated. Pancreas: Mild global fatty infiltration of the pancreas without obvious mass lesion. Spleen: Spleen is nonenlarged. Adrenals/Urinary Tract: Both adrenal glands has some nodular thickening diffusely inner unchanged in appearance from previous examination. Probable benign hypertrophy. No collecting system dilatation of either kidney. There are some tiny cystic foci in each kidney. Many areas are simply too small to completely characterize. The largest is seen anteriorly on left and measures 13 mm in transverse dimension and has Hounsfield unit of 0, Bosniak 1 cysts. No specific imaging follow-up recommended. Preserved contours of the urinary bladder which is underdistended. Stomach/Bowel: Large bowel has a normal course and caliber with scattered stool. Left-sided colonic diverticula. Normal retrocecal appendix. The stomach and small bowel are nondilated. No free air or free fluid. Vascular/Lymphatic: Normal caliber aorta and IVC. Scattered atherosclerotic plaque. IVC filter in place. The tines of the filter extend along the outside edge of the  margins of the IVC in some locations, nonspecific.No developing abnormal lymph node enlargement seen in the abdomen. There are some enlarged nodes however in the pelvis. Example left pelvic sidewall on image 75 of series 3 measures 2.1 x 1.1 cm. Right-sided focus along the external iliac chain measures 2.8 by 1.1 cm. Few small nodes at the aortic bifurcation as well. Reproductive: Prostate is unremarkable. In addition there is significant inflammatory stranding along the right inguinal region extending towards the margin of the scrotum with skin thickening and ill-defined fluid there is also some small pockets of loculated fluid with air such as axial images 109 through 113 this complex area of fluid would measure  cephalocaudal length of 3.3 cm and axial plane 3.2 by 1.7 cm. Other: No abdominal wall hernia or abnormality. No abdominopelvic ascites. Musculoskeletal: Scattered degenerative changes along the spine and pelvis. IMPRESSION: Inflammatory stranding, ill-defined areas of fluid and air noted along the right inguinal canal extending down towards the scrotum with skin thickening. Small either early abscess or phlegmonous process area developing. With a gas recommend close follow up to exclude an aggressive soft tissue infection. There are several mildly enlarged pelvic nodes. Based on the inguinal findings these could be reactive however would recommend follow-up to confirm resolution and provide surveillance. IVC filter. Few colonic diverticula. Critical Value/emergent results were called by telephone at the time of interpretation on 11/03/2022 at 12:35 pm to provider [facility] , who verbally acknowledged these results. Electronically Signed   By: Jill Side M.D.   On: 11/03/2022 12:46        Scheduled Meds:  allopurinol  100 mg Oral Daily   hydrALAZINE  25 mg Oral BID   hydrochlorothiazide  25 mg Oral Daily   lisinopril  40 mg Oral Daily   sodium chloride flush  3 mL Intravenous Q12H   Continuous Infusions:  piperacillin-tazobactam (ZOSYN)  IV 3.375 g (11/04/22 0511)     LOS: 1 day   Time spent= 35 mins    Selda Jalbert Arsenio Loader, MD [facility]  If 7PM-7AM, please contact night-coverage  11/04/2022, 8:06 AM

## 2022-11-04 NOTE — Progress Notes (Signed)
ANTICOAGULATION CONSULT NOTE - Initial Consult  Pharmacy Consult for lovenox Indication: Hx PE/CVT  Allergies  Allergen Reactions   Clindamycin/Lincomycin Diarrhea and Nausea Only    Pt reports 8-10 years ago had bad nausea and diarrhea. Says this is the only antibiotic he's had a problem with.    Plasma Protein Fraction Other (See Comments)    Patient Measurements: Height: 5\' 8"  (172.7 cm) Weight: (!) 147.4 kg (325 lb) IBW/kg (Calculated) : 68.4  Vital Signs: Temp: 98 F (36.7 C) (01/16 1230) Temp Source: Oral (01/16 0835) BP: 153/78 (01/16 1330) Pulse Rate: 69 (01/16 1330)  Labs: Recent Labs    11/03/22 1045 11/03/22 1126 11/03/22 1138 11/04/22 0449  HGB 14.5  --  15.0 14.2  HCT 45.1  --  44.0 41.8  PLT 223  --   --  247  LABPROT  --  25.9*  --  23.7*  INR  --  2.4*  --  2.1*  CREATININE 0.84  --  0.80 0.80     Estimated Creatinine Clearance: 147.6 mL/min (by C-G formula based on SCr of 0.8 mg/dL).   Medical History: Past Medical History:  Diagnosis Date   Clotting disorder ([place])    GERD (gastroesophageal reflux disease)    Hypertension    Lumbar disc disease    Morbid obesity ([place])    MRSA infection 2005   facial   OSA (obstructive sleep apnea)    Pulmonary embolus ([place]) 2008   recurrent    Assessment: 75 YOM presenting with inguinal abscess, hx of PE on warfarin PTA, on hold for possible surgical procedure.    INR 2.1, therapeutic this AM, trending down. Hgb 14.2, trending down, slightly Plt 247 - stable No s/sx of bleeding  Goal of Therapy:  Anti-Xa level 0.6-1 units/ml 4hrs after LMWH dose given Monitor platelets by anticoagulation protocol: Yes   Plan:  Per discussion with general surgery, Barkley Boards -PA-C, continue to hold of on enoxaparin for anticoagulation bridging for now. Plan to re-assess in AM and may consider discharging home.  INR in AM, f/u plans for surgery vs. discharge with general surgery team in AM Daily INR, CBC, s/s  bleeding  Luisa Hart, PharmD, BCPS Clinical Pharmacist 11/04/2022 2:24 PM   Please refer to [facility] for pharmacy phone number

## 2022-11-05 DIAGNOSIS — Z86711 Personal history of pulmonary embolism: Secondary | ICD-10-CM | POA: Diagnosis not present

## 2022-11-05 DIAGNOSIS — L0291 Cutaneous abscess, unspecified: Secondary | ICD-10-CM | POA: Diagnosis not present

## 2022-11-05 LAB — BASIC METABOLIC PANEL
Anion gap: 9 (ref 5–15)
BUN: 20 mg/dL (ref 6–20)
CO2: 24 mmol/L (ref 22–32)
Calcium: 8.8 mg/dL — ABNORMAL LOW (ref 8.9–10.3)
Chloride: 106 mmol/L (ref 98–111)
Creatinine, Ser: 0.84 mg/dL (ref 0.61–1.24)
GFR, Estimated: >60 mL/min (ref >60)
Glucose, Bld: 101 mg/dL — ABNORMAL HIGH (ref 70–99)
Potassium: 3.5 mmol/L (ref 3.5–5.1)
Sodium: 139 mmol/L (ref 135–145)

## 2022-11-05 LAB — CBC
HCT: 42.6 % (ref 39.0–52.0)
Hemoglobin: 14.4 g/dL (ref 13.0–17.0)
MCH: 29.1 pg (ref 26.0–34.0)
MCHC: 33.8 g/dL (ref 30.0–36.0)
MCV: 86.2 fL (ref 80.0–100.0)
Platelets: 273 10*3/uL (ref 150–400)
RBC: 4.94 MIL/uL (ref 4.22–5.81)
RDW: 15.1 % (ref 11.5–15.5)
WBC: 9.2 10*3/uL (ref 4.0–10.5)
nRBC: 0 % (ref 0.0–0.2)

## 2022-11-05 LAB — PROTIME-INR
INR: 1.7 — ABNORMAL HIGH (ref 0.8–1.2)
Prothrombin Time: 19.8 seconds — ABNORMAL HIGH (ref 11.4–15.2)

## 2022-11-05 LAB — GLUCOSE, CAPILLARY: Glucose-Capillary: 104 mg/dL — ABNORMAL HIGH (ref 70–99)

## 2022-11-05 LAB — MAGNESIUM: Magnesium: 2.1 mg/dL (ref 1.7–2.4)

## 2022-11-05 MED ORDER — CALCIUM CARBONATE ANTACID 500 MG PO CHEW
400.0000 mg | CHEWABLE_TABLET | Freq: Four times a day (QID) | ORAL | Status: DC | PRN
  Administered 2022-11-06: 400 mg via ORAL
  Filled 2022-11-05: qty 2

## 2022-11-05 MED ORDER — HYDROMORPHONE HCL 1 MG/ML IJ SOLN
0.5000 mg | Freq: Once | INTRAMUSCULAR | Status: AC
  Administered 2022-11-05: 0.5 mg via INTRAVENOUS
  Filled 2022-11-05: qty 0.5

## 2022-11-05 MED ORDER — LIDOCAINE-EPINEPHRINE 2 %-1:50000 IJ SOLN
10.0000 mL | Freq: Once | INTRAMUSCULAR | Status: DC
  Filled 2022-11-05 (×3): qty 10.2

## 2022-11-05 MED ORDER — HYDROMORPHONE HCL 1 MG/ML IJ SOLN: 0.5000 mg | INTRAMUSCULAR | Status: DC | PRN

## 2022-11-05 MED ORDER — INSULIN ASPART 100 UNIT/ML IJ SOLN: 0.0000 [IU] | Freq: Three times a day (TID) | INTRAMUSCULAR | Status: DC

## 2022-11-05 MED ORDER — LIDOCAINE-EPINEPHRINE 2 %-1:100000 IJ SOLN
10.0000 mL | Freq: Once | INTRAMUSCULAR | Status: AC
  Administered 2022-11-05: 10 mL via INTRADERMAL
  Filled 2022-11-05: qty 10.2

## 2022-11-05 MED ORDER — INSULIN ASPART 100 UNIT/ML IJ SOLN: 0.0000 [IU] | Freq: Every day | INTRAMUSCULAR | Status: DC

## 2022-11-05 NOTE — Procedures (Signed)
Incision and Drainage Procedure Note  Pre-operative Diagnosis: right inguinal abscess  Post-operative Diagnosis: same  Indications: right inguinal abscess, treatment of infection  Anesthesia: 1% lidocaine with epinephrine, 0.5 mg IV dilaudid   Procedure Details  The procedure, risks and complications have been discussed in detail (including, but not limited to airway compromise, infection, bleeding) with the patient, and the patient has signed consent to the procedure.  The skin was sterilely prepped and draped over the affected area in the usual fashion. After adequate local anesthesia, small cruciate incision with a #11 blade was performed on the right groin over an area of fluctuance. Purulent drainage: present The patient was observed until stable.  Findings: Purulent and sanguinous drainage.   UDJ:[id number]  Drains: none   Condition: Tolerated procedure well   Complications: none. Mild sanguinis oozing controlled with packing and direct pressure.    Plan: leave packing in place tonight. Plan to remove packing tomorrow and start daily dressing changes with loose iodoform packing.     Obie Dredge, PA-C [facility] Please see Amion for pager number during day hours 7:00am-4:30pm

## 2022-11-05 NOTE — Progress Notes (Signed)
Progress Note    [name]   EXH:[id number]  DOB: [birth year]  DOA: 11/03/2022     2 PCP: Aletha Halim., PA-C  Initial CC: right groin wound  Hospital Course: [name] is a 57 yo male with PMH PE, HTN, obesity, GERD, OSA who presented with a right groin wound. CT Abdo/pelvis was obtained on admission which showed inflammatory stranding and ill-defined areas of fluid and air along the right inguinal canal extending towards the scrotum with skin thickening.  He was started on Zosyn and admitted for general surgery consultation.  Interval History:  No events overnight.  Wife present bedside this morning.  Still having some ongoing pain and discomfort in the right groin.  Overall has seemed to respond to treatment and wound has continued to have purulent drainage since admission. Patient undergoing bedside I&D today with surgery.   Assessment and Plan:  Right inguinal abscess -Spontaneous draining since admission and underwent further debridement bedside with surgery on 11/05/2022 - Continue Zosyn   Leukocytosis Secondary to underlying infection.   History of pulmonary embolism on chronic anticoagulation Status post IVC filter Initially diagnosed in 2008 and since then he has been on Coumadin.  IVC filter was also placed around that time. - coumadin on hold in case of need for further procedures;  INR 1.7 today -Will discuss timing of resumption with surgery   Controlled Diabetes mellitus type 2, without long-term use of insulin -Metformin on hold - Continue SSI and CBG monitoring -Overall well-controlled, A1c 5.4% on admission   Essential hypertension -Continue hydralazine, HCTZ, lisinopril   Morbid obesity - Complicates overall prognosis and care - Body mass index is 49.42 kg/m.   Old records reviewed in assessment of this patient  Antimicrobials: Zosyn 11/03/2022 >> current  DVT prophylaxis:   On hold  Code Status:   Code Status: Full  Code  Mobility Assessment (last 72 hours)     Mobility Assessment     Row Name 11/05/22 0730 11/04/22 2139 11/04/22 1531 11/04/22 1525 11/03/22 22:14:30   Does patient have an order for bedrest or is patient medically unstable No - Continue assessment -- No - Continue assessment No - Continue assessment No - Continue assessment   What is the highest level of mobility based on the progressive mobility assessment? Level 6 (Walks independently in room and hall) - Balance while walking in room without assist - Complete Level 6 (Walks independently in room and hall) - Balance while walking in room without assist - Complete Level 6 (Walks independently in room and hall) - Balance while walking in room without assist - Complete Level 6 (Walks independently in room and hall) - Balance while walking in room without assist - Complete Level 5 (Walks with assist in room/hall) - Balance while stepping forward/back and can walk in room with assist - Complete            Barriers to discharge:  Disposition Plan:  Home Status is: Inpt  Objective: Blood pressure 125/71, pulse 70, temperature 97.9 F (36.6 C), temperature source Oral, resp. rate 18, height 5\' 8"  (1.727 m), weight (!) 147.4 kg, SpO2 93 %.  Examination:  Physical Exam Constitutional:      General: He is not in acute distress.    Appearance: Normal appearance.  HENT:     Head: Normocephalic and atraumatic.     Mouth/Throat:     Mouth: Mucous membranes are moist.  Eyes:     Extraocular Movements: Extraocular movements intact.  Cardiovascular:     Rate and Rhythm: Normal rate and regular rhythm.     Heart sounds: Normal heart sounds.  Pulmonary:     Effort: Pulmonary effort is normal. No respiratory distress.     Breath sounds: Normal breath sounds. No wheezing.  Abdominal:     General: Bowel sounds are normal. There is no distension.     Palpations: Abdomen is soft.     Tenderness: There is no abdominal tenderness.   Genitourinary:    Comments: Right groin noted with ~3-4 cm oval shaped area of induration and surrounding erythema and tenderness. Mild purulent drainage noted on gauze Musculoskeletal:        General: Normal range of motion.     Cervical back: Normal range of motion and neck supple.  Skin:    General: Skin is warm and dry.  Neurological:     General: No focal deficit present.     Mental Status: He is alert.  Psychiatric:        Mood and Affect: Mood normal.        Behavior: Behavior normal.      Consultants:  General surgery  Procedures:    Data Reviewed: Results for orders placed or performed during the hospital encounter of 11/03/22 (from the past 24 hour(s))  Protime-INR     Status: Abnormal   Collection Time: 11/05/22  4:05 AM  Result Value Ref Range   Prothrombin Time 19.8 (H) 11.4 - 15.2 seconds   INR 1.7 (H) 0.8 - 1.2  Basic metabolic panel     Status: Abnormal   Collection Time: 11/05/22  4:05 AM  Result Value Ref Range   Sodium 139 135 - 145 mmol/L   Potassium 3.5 3.5 - 5.1 mmol/L   Chloride 106 98 - 111 mmol/L   CO2 24 22 - 32 mmol/L   Glucose, Bld 101 (H) 70 - 99 mg/dL   BUN 20 6 - 20 mg/dL   Creatinine, Ser 0.84 0.61 - 1.24 mg/dL   Calcium 8.8 (L) 8.9 - 10.3 mg/dL   GFR, Estimated >60 >60 mL/min   Anion gap 9 5 - 15  CBC     Status: None   Collection Time: 11/05/22  4:05 AM  Result Value Ref Range   WBC 9.2 4.0 - 10.5 K/uL   RBC 4.94 4.22 - 5.81 MIL/uL   Hemoglobin 14.4 13.0 - 17.0 g/dL   HCT 42.6 39.0 - 52.0 %   MCV 86.2 80.0 - 100.0 fL   MCH 29.1 26.0 - 34.0 pg   MCHC 33.8 30.0 - 36.0 g/dL   RDW 15.1 11.5 - 15.5 %   Platelets 273 150 - 400 K/uL   nRBC 0.0 0.0 - 0.2 %  Magnesium     Status: None   Collection Time: 11/05/22  4:05 AM  Result Value Ref Range   Magnesium 2.1 1.7 - 2.4 mg/dL    I have reviewed pertinent nursing notes, vitals, labs, and images as necessary. I have ordered labwork to follow up on as indicated.  I have reviewed  the last notes from staff over past 24 hours. I have discussed patient's care plan and test results with nursing staff, CM/SW, and other staff as appropriate.  Time spent: Greater than 50% of the 55 minute visit was spent in counseling/coordination of care for the patient as laid out in the A&P.   LOS: 2 days   Dwyane Dee, MD [facility] 11/05/2022, 4:38 PM

## 2022-11-05 NOTE — Plan of Care (Signed)

## 2022-11-05 NOTE — Progress Notes (Signed)
Progress Note     Subjective: Pt reports pressure in his groin. Reports less drainage in the last 24 hours. Says he feels like its minimally improved. Continues to change guaze every 3-4 hours when he gets up to go the bathroom. Denies urinary sxs   AFVSS  Objective: Vital signs in last 24 hours: Temp:  [97.5 F (36.4 C)-98.5 F (36.9 C)] 97.7 F (36.5 C) (01/17 0736) Pulse Rate:  [59-88] 69 (01/17 0736) Resp:  [16-21] 18 (01/17 0359) BP: (106-153)/(56-89) 106/69 (01/17 0736) SpO2:  [93 %-98 %] 95 % (01/17 0736) Last BM Date : 11/04/22  Intake/Output from previous day: 01/16 0701 - 01/17 0700 In: 389.4 [P.O.:360; I.V.:3; IV Piggyback:26.4] Out: -  Intake/Output this shift: No intake/output data recorded.  PE: General: pleasant, WD, obese male who is laying in bed in NAD Heart: regular, rate, and rhythm.   Lungs: Respiratory effort nonlabored Abd: soft, NT, ND, +BS, no masses, hernias, or organomegaly GU: erythema and induration of right mons pubis ~8 cm, there is a 1-2 cm area of fluctuance that is spontaneously draining when you press on it, does not appear to involve right testicle or penis, no crepitus  Psych: A&Ox3 with an appropriate affect.  Lab Results:  Recent Labs    11/04/22 0449 11/05/22 0405  WBC 11.5* 9.2  HGB 14.2 14.4  HCT 41.8 42.6  PLT 247 273   BMET Recent Labs    11/04/22 0449 11/05/22 0405  NA 138 139  K 3.6 3.5  CL 104 106  CO2 23 24  GLUCOSE 103* 101*  BUN 21* 20  CREATININE 0.80 0.84  CALCIUM 9.0 8.8*   PT/INR Recent Labs    11/04/22 0449 11/05/22 0405  LABPROT 23.7* 19.8*  INR 2.1* 1.7*   CMP     Component Value Date/Time   NA 139 11/05/2022 0405   K 3.5 11/05/2022 0405   CL 106 11/05/2022 0405   CO2 24 11/05/2022 0405   GLUCOSE 101 (H) 11/05/2022 0405   BUN 20 11/05/2022 0405   CREATININE 0.84 11/05/2022 0405   CALCIUM 8.8 (L) 11/05/2022 0405   PROT 7.4 11/03/2022 1045   ALBUMIN 3.4 (L) 11/03/2022 1045   AST  25 11/03/2022 1045   ALT 27 11/03/2022 1045   ALKPHOS 37 (L) 11/03/2022 1045   BILITOT 1.2 11/03/2022 1045   GFRNONAA >60 11/05/2022 0405   Lipase  No results found for: "LIPASE"     Studies/Results: CT ABDOMEN PELVIS W CONTRAST  Result Date: 11/03/2022 CLINICAL DATA:  Hernia suspected.  Pain. EXAM: CT ABDOMEN AND PELVIS WITH CONTRAST TECHNIQUE: Multidetector CT imaging of the abdomen and pelvis was performed using the standard protocol following bolus administration of intravenous contrast. RADIATION DOSE REDUCTION: This exam was performed according to the departmental dose-optimization program which includes automated exposure control, adjustment of the mA and/or kV according to patient size and/or use of iterative reconstruction technique. CONTRAST:  46mL OMNIPAQUE IOHEXOL 350 MG/ML SOLN COMPARISON:  CT 2015 feb FINDINGS: Lower chest: There is some breathing motion at the lung bases. Slight basilar scar or atelectasis. No pleural effusion. Hepatobiliary: The liver has some tiny low-attenuation lesions, too small to characterize likely small cysts. Example segment 8 measures 8 mm. Tiny focus in segment 7. These were not clearly seen in 2015. Patent portal vein. Gallbladder is nondilated. Pancreas: Mild global fatty infiltration of the pancreas without obvious mass lesion. Spleen: Spleen is nonenlarged. Adrenals/Urinary Tract: Both adrenal glands has some nodular thickening diffusely inner  unchanged in appearance from previous examination. Probable benign hypertrophy. No collecting system dilatation of either kidney. There are some tiny cystic foci in each kidney. Many areas are simply too small to completely characterize. The largest is seen anteriorly on left and measures 13 mm in transverse dimension and has Hounsfield unit of 0, Bosniak 1 cysts. No specific imaging follow-up recommended. Preserved contours of the urinary bladder which is underdistended. Stomach/Bowel: Large bowel has a normal  course and caliber with scattered stool. Left-sided colonic diverticula. Normal retrocecal appendix. The stomach and small bowel are nondilated. No free air or free fluid. Vascular/Lymphatic: Normal caliber aorta and IVC. Scattered atherosclerotic plaque. IVC filter in place. The tines of the filter extend along the outside edge of the margins of the IVC in some locations, nonspecific.No developing abnormal lymph node enlargement seen in the abdomen. There are some enlarged nodes however in the pelvis. Example left pelvic sidewall on image 75 of series 3 measures 2.1 x 1.1 cm. Right-sided focus along the external iliac chain measures 2.8 by 1.1 cm. Few small nodes at the aortic bifurcation as well. Reproductive: Prostate is unremarkable. In addition there is significant inflammatory stranding along the right inguinal region extending towards the margin of the scrotum with skin thickening and ill-defined fluid there is also some small pockets of loculated fluid with air such as axial images 109 through 113 this complex area of fluid would measure cephalocaudal length of 3.3 cm and axial plane 3.2 by 1.7 cm. Other: No abdominal wall hernia or abnormality. No abdominopelvic ascites. Musculoskeletal: Scattered degenerative changes along the spine and pelvis. IMPRESSION: Inflammatory stranding, ill-defined areas of fluid and air noted along the right inguinal canal extending down towards the scrotum with skin thickening. Small either early abscess or phlegmonous process area developing. With a gas recommend close follow up to exclude an aggressive soft tissue infection. There are several mildly enlarged pelvic nodes. Based on the inguinal findings these could be reactive however would recommend follow-up to confirm resolution and provide surveillance. IVC filter. Few colonic diverticula. Critical Value/emergent results were called by telephone at the time of interpretation on 11/03/2022 at 12:35 pm to provider [facility] , who verbally acknowledged these results. Electronically Signed   By: Jill Side M.D.   On: 11/03/2022 12:46    Anti-infectives: Anti-infectives (From admission, onward)    Start     Dose/Rate Route Frequency Ordered Stop   11/03/22 1415  piperacillin-tazobactam (ZOSYN) IVPB 3.375 g        3.375 g 12.5 mL/hr over 240 Minutes Intravenous Every 8 hours 11/03/22 1409          Assessment/Plan Small right inguinal abscess - WBC 12 > 11 > 9 today, afebrile - abscess remains indurated, tender, and drainage has slowed down. I think this would benefit from bedside I&D to express all of the purulent drainage. INR is 1.7 which I think is acceptable for the procedure. Will confirm this plan with my attending prior to performing this afternoon. - warm compresses q4h - MRSA swab negative   ID - Zosyn 1/15>> FEN - ok to have a diet from surgery standpoint VTE - SCDs, hold coumadin, IVC filter in place   - below per TRH -  Hx of DVT on coumadin and IVC filter in place T2DM Morbid obesity - BMI 49.42   LOS: 2 days   I reviewed hospitalist notes, last 24 h vitals and pain scores, last 48 h intake and output, and last 24  h labs and trends.     Jill Alexanders, [facility] Surgery 11/05/2022, 10:31 AM Please see Amion for pager number during day hours 7:00am-4:30pm

## 2022-11-05 NOTE — Hospital Course (Addendum)
[name] is a 57 yo male with PMH PE, HTN, obesity, GERD, OSA who presented with a right groin wound. CT Abdo/pelvis was obtained on admission which showed inflammatory stranding and ill-defined areas of fluid and air along the right inguinal canal extending towards the scrotum with skin thickening.  He was started on Zosyn and admitted for general surgery consultation. He underwent bedside I&D on 11/05/2022 and was continued on Augmentin to complete course at discharge.  He will plan to follow-up outpatient with general surgery as well.

## 2022-11-06 DIAGNOSIS — L02214 Cutaneous abscess of groin: Secondary | ICD-10-CM | POA: Diagnosis not present

## 2022-11-06 DIAGNOSIS — Z86711 Personal history of pulmonary embolism: Secondary | ICD-10-CM | POA: Diagnosis not present

## 2022-11-06 LAB — CBC WITH DIFFERENTIAL/PLATELET
Abs Immature Granulocytes: 0.02 10*3/uL (ref 0.00–0.07)
Basophils Absolute: 0.1 10*3/uL (ref 0.0–0.1)
Basophils Relative: 1 %
Eosinophils Absolute: 0.5 10*3/uL (ref 0.0–0.5)
Eosinophils Relative: 5 %
HCT: 44.3 % (ref 39.0–52.0)
Hemoglobin: 14.6 g/dL (ref 13.0–17.0)
Immature Granulocytes: 0 %
Lymphocytes Relative: 20 %
Lymphs Abs: 1.9 10*3/uL (ref 0.7–4.0)
MCH: 29 pg (ref 26.0–34.0)
MCHC: 33 g/dL (ref 30.0–36.0)
MCV: 87.9 fL (ref 80.0–100.0)
Monocytes Absolute: 0.8 10*3/uL (ref 0.1–1.0)
Monocytes Relative: 9 %
Neutro Abs: 6.1 10*3/uL (ref 1.7–7.7)
Neutrophils Relative %: 65 %
Platelets: 268 10*3/uL (ref 150–400)
RBC: 5.04 MIL/uL (ref 4.22–5.81)
RDW: 15.3 % (ref 11.5–15.5)
WBC: 9.4 10*3/uL (ref 4.0–10.5)
nRBC: 0 % (ref 0.0–0.2)

## 2022-11-06 LAB — BASIC METABOLIC PANEL
Anion gap: 12 (ref 5–15)
BUN: 20 mg/dL (ref 6–20)
CO2: 25 mmol/L (ref 22–32)
Calcium: 9.3 mg/dL (ref 8.9–10.3)
Chloride: 102 mmol/L (ref 98–111)
Creatinine, Ser: 0.94 mg/dL (ref 0.61–1.24)
GFR, Estimated: >60 mL/min (ref >60)
Glucose, Bld: 101 mg/dL — ABNORMAL HIGH (ref 70–99)
Potassium: 3.6 mmol/L (ref 3.5–5.1)
Sodium: 139 mmol/L (ref 135–145)

## 2022-11-06 LAB — PROTIME-INR
INR: 1.4 — ABNORMAL HIGH (ref 0.8–1.2)
Prothrombin Time: 17.2 seconds — ABNORMAL HIGH (ref 11.4–15.2)

## 2022-11-06 LAB — MAGNESIUM: Magnesium: 2 mg/dL (ref 1.7–2.4)

## 2022-11-06 LAB — GLUCOSE, CAPILLARY
Glucose-Capillary: 112 mg/dL — ABNORMAL HIGH (ref 70–99)
Glucose-Capillary: 129 mg/dL — ABNORMAL HIGH (ref 70–99)

## 2022-11-06 MED ORDER — OXYCODONE HCL 5 MG PO TABS: 5.0000 mg | ORAL_TABLET | Freq: Four times a day (QID) | ORAL | 0 refills | Status: AC | PRN

## 2022-11-06 MED ORDER — AMOXICILLIN-POT CLAVULANATE 875-125 MG PO TABS: 1.0000 | ORAL_TABLET | Freq: Two times a day (BID) | ORAL | 0 refills | Status: AC

## 2022-11-06 NOTE — Discharge Summary (Signed)
Physician Discharge Summary   [name] HUT:[id number] DOB: 05/30/1966 DOA: 11/03/2022  PCP: Richmond Campbell., PA-C  Admit date: 11/03/2022 Discharge date: 11/06/2022 Barriers to discharge: none  Admitted From: Home Disposition:  Home Discharging physician: Lewie Chamber, MD  Recommendations for Outpatient Follow-up:  Follow up with general surgery  Home Health:  Equipment/Devices:   Discharge Condition: stable CODE STATUS: Full Diet recommendation:  Diet Orders (From admission, onward)     Start     Ordered   11/06/22 0934  Diet Carb Modified Fluid consistency: Thin; Room service appropriate? Yes  Diet effective now       Question Answer Comment  Diet-HS Snack? Nothing   Calorie Level Medium 1600-2000   Fluid consistency: Thin   Room service appropriate? Yes      11/06/22 0933   11/06/22 0000  Diet Carb Modified        11/06/22 1148            Hospital Course: Mr. Spielmann is a 57 yo male with PMH PE, HTN, obesity, GERD, OSA who presented with a right groin wound. CT Abdo/pelvis was obtained on admission which showed inflammatory stranding and ill-defined areas of fluid and air along the right inguinal canal extending towards the scrotum with skin thickening.  He was started on Zosyn and admitted for general surgery consultation. He underwent bedside I&D on 11/05/2022 and was continued on Augmentin to complete course at discharge.  He will plan to follow-up outpatient with general surgery as well.  Assessment and Plan:  Right inguinal abscess -Spontaneous draining since admission and underwent further debridement bedside with surgery on 11/05/2022 - s/p Zosyn in hospital; transitioned to Augmentin for 7 more days at discharge - Outpatient follow-up with general surgery   Leukocytosis -resolved Secondary to underlying infection.   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  IVC filter was also placed around that time. -Coumadin held during hospitalization in case of need for procedures - Coumadin resumed at discharge  Controlled Diabetes mellitus type 2, without long-term use of insulin -Metformin resumed at discharge -Overall well-controlled, A1c 5.4% on admission   Essential hypertension -Continue hydralazine, HCTZ, lisinopril   Morbid obesity - Complicates overall prognosis and care - Body mass index is 49.42 kg/m.    The patient's chronic medical conditions were treated accordingly per the patient's home medication regimen except as noted.  On day of discharge, patient was felt deemed stable for discharge. Patient/family member advised to call PCP or come back to ER if needed.   Principal Diagnosis: Groin abscess  Discharge Diagnoses: Active Hospital Problems   Diagnosis Date Noted   Groin abscess 11/03/2022    Priority: High   History of pulmonary embolism 11/03/2022    Priority: Medium    Leukocytosis 11/03/2022    Priority: Medium    Chronic anticoagulation 11/03/2022    Priority: Medium    S/P IVC filter 01/03/2015    Priority: Medium    Morbid obesity (HCC) 01/03/2015    Priority: Low   Hypertension 02/20/2012    Priority: Low    Resolved Hospital Problems  No resolved problems to display.     Discharge Instructions     Diet Carb Modified   Complete by: As directed    Discharge wound care:   Complete by: As directed    Pack inguinal wound daily and as needed for contamination. Wound can  be packed loosely with strip of 1/4 inch iodoform - leaving a short tail so it is easy to remove. Cover with 4x4 gauze and secure in pace with underwear.   Increase activity slowly   Complete by: As directed       Allergies as of 11/06/2022       Reactions   Clindamycin/lincomycin Diarrhea, Nausea Only   Pt reports 8-10 years ago had bad nausea and diarrhea. Says this is the only antibiotic he's had a problem  with.    Plasma Protein Fraction Other (See Comments)        Medication List     TAKE these medications    allopurinol 300 MG tablet Commonly known as: ZYLOPRIM Take 300 mg by mouth daily.   amLODipine 10 MG tablet Commonly known as: NORVASC Take 10 mg by mouth daily.   amoxicillin-clavulanate 875-125 MG tablet Commonly known as: AUGMENTIN Take 1 tablet by mouth 2 (two) times daily for 7 days.   hydrALAZINE 25 MG tablet Commonly known as: APRESOLINE Take 25 mg by mouth 3 (three) times daily.   hydrochlorothiazide 25 MG tablet Commonly known as: HYDRODIURIL Take 25 mg by mouth daily.   lisinopril 40 MG tablet Commonly known as: ZESTRIL Take 40 mg by mouth daily.   metFORMIN 500 MG 24 hr tablet Commonly known as: GLUCOPHAGE-XR Take 500 mg by mouth daily.   oxyCODONE 5 MG immediate release tablet Commonly known as: Oxy IR/ROXICODONE Take 1 tablet (5 mg total) by mouth every 6 (six) hours as needed for moderate pain or severe pain (not controlled with 1,000 mg tylenol).   rosuvastatin 20 MG tablet Commonly known as: CRESTOR Take 20 mg by mouth daily.   warfarin 5 MG tablet Commonly known as: COUMADIN Take 5-10 mg by mouth See admin instructions. Take 5mg  by mouth on Mondays. Take 10mg  by mouth on all other days.               Discharge Care Instructions  (From admission, onward)           Start     Ordered   11/06/22 0000  Discharge wound care:       Comments: Pack inguinal wound daily and as needed for contamination. Wound can be packed loosely with strip of 1/4 inch iodoform - leaving a short tail so it is easy to remove. Cover with 4x4 gauze and secure in pace with underwear.   11/06/22 1148            Follow-up Information     Aletha Halim., PA-C Follow up.   Specialty: Family Medicine Contact information: [address] [place] [address] [phone]         Lucita Ferrara, PA-C Follow up.   Specialty: General  Surgery Why: our office is scheduling you for post-operative follow up for a wound check. please call to confirm appointment date and time. Contact information: [address] [place] [address] [phone]                Allergies  Allergen Reactions   Clindamycin/Lincomycin Diarrhea and Nausea Only    Pt reports 8-10 years ago had bad nausea and diarrhea. Says this is the only antibiotic he's had a problem with.    Plasma Protein Fraction Other (See Comments)    Consultations: General surgery  Procedures: Bedside I&D, 11/05/22  Discharge Exam: BP 128/79 (BP Location: Left Arm)   Pulse 62   Temp 98.5  F (36.9 C) (Oral)   Resp 17   Ht 5\' 8"  (1.727 m)   Wt (!) 147.4 kg   SpO2 92%   BMI 49.42 kg/m  Physical Exam Constitutional:      General: He is not in acute distress.    Appearance: Normal appearance.  HENT:     Head: Normocephalic and atraumatic.     Mouth/Throat:     Mouth: Mucous membranes are moist.  Eyes:     Extraocular Movements: Extraocular movements intact.  Cardiovascular:     Rate and Rhythm: Normal rate and regular rhythm.     Heart sounds: Normal heart sounds.  Pulmonary:     Effort: Pulmonary effort is normal. No respiratory distress.     Breath sounds: Normal breath sounds. No wheezing.  Abdominal:     General: Bowel sounds are normal. There is no distension.     Palpations: Abdomen is soft.     Tenderness: There is no abdominal tenderness.  Genitourinary:    Comments: Right groin noted with ~3-4 cm oval shaped area of induration and surrounding erythema and tenderness. Mild purulent drainage noted on gauze Musculoskeletal:        General: Normal range of motion.     Cervical back: Normal range of motion and neck supple.  Skin:    General: Skin is warm and dry.  Neurological:     General: No focal deficit present.     Mental Status: He is alert.  Psychiatric:        Mood and Affect: Mood normal.        Behavior:  Behavior normal.      The results of significant diagnostics from this hospitalization (including imaging, microbiology, ancillary and laboratory) are listed below for reference.   Microbiology: Recent Results (from the past 240 hour(s))  Blood culture (routine x 2)     Status: None (Preliminary result)   Collection Time: 11/03/22 11:06 AM   Specimen: BLOOD RIGHT FOREARM  Result Value Ref Range Status   Specimen Description BLOOD RIGHT FOREARM  Final   Special Requests   Final    BOTTLES DRAWN AEROBIC AND ANAEROBIC Blood Culture adequate volume   Culture   Final    NO GROWTH 3 DAYS Performed at [facility], [address], [place], [place]    Report Status PENDING  Incomplete  MRSA Next Gen by PCR, Nasal     Status: None   Collection Time: 11/04/22  8:25 AM   Specimen: Nasal Mucosa; Nasal Swab  Result Value Ref Range Status   MRSA by PCR Next Gen NOT DETECTED NOT DETECTED Final    Comment: (NOTE) The GeneXpert MRSA Assay (FDA approved for NASAL specimens only), is one component of a comprehensive MRSA colonization surveillance program. It is not intended to diagnose MRSA infection nor to guide or monitor treatment for MRSA infections. Test performance is not FDA approved in patients less than 64 years old. Performed at [facility], [address], [place], [place]      Labs: BNP (last 3 results) No results for input(s): "BNP" in the last 8760 hours. Basic Metabolic Panel: Recent Labs  Lab 11/03/22 1045 11/03/22 1138 11/04/22 0449 11/05/22 0405 11/06/22 0551  NA 139 140 138 139 139  K 3.6 3.7 3.6 3.5 3.6  CL 106 106 104 106 102  CO2 26  --  23 24 25   GLUCOSE 92 86 103* 101* 101*  BUN 20 23* 21*  20 20  CREATININE 0.84 0.80 0.80 0.84 0.94  CALCIUM 9.0  --  9.0 8.8* 9.3  MG  --   --   --  2.1 2.0   Liver Function Tests: Recent Labs  Lab 11/03/22 1045  AST 25  ALT 27  ALKPHOS 37*  BILITOT 1.2  PROT 7.4  ALBUMIN 3.4*   No  results for input(s): "LIPASE", "AMYLASE" in the last 168 hours. No results for input(s): "AMMONIA" in the last 168 hours. CBC: Recent Labs  Lab 11/03/22 1045 11/03/22 1138 11/04/22 0449 11/05/22 0405 11/06/22 0551  WBC 12.8*  --  11.5* 9.2 9.4  NEUTROABS 9.7*  --   --   --  6.1  HGB 14.5 15.0 14.2 14.4 14.6  HCT 45.1 44.0 41.8 42.6 44.3  MCV 89.0  --  86.4 86.2 87.9  PLT 223  --  247 273 268   Cardiac Enzymes: No results for input(s): "CKTOTAL", "CKMB", "CKMBINDEX", "TROPONINI" in the last 168 hours. BNP: Invalid input(s): "POCBNP" CBG: Recent Labs  Lab 11/03/22 1107 11/05/22 1908 11/06/22 0730 11/06/22 1152  GLUCAP 94 104* 112* 129*   D-Dimer No results for input(s): "DDIMER" in the last 72 hours. Hgb A1c Recent Labs    11/04/22 0449  HGBA1C 5.4   Lipid Profile No results for input(s): "CHOL", "HDL", "LDLCALC", "TRIG", "CHOLHDL", "LDLDIRECT" in the last 72 hours. Thyroid function studies Recent Labs    11/04/22 0849  TSH 2.227   Anemia work up No results for input(s): "VITAMINB12", "FOLATE", "FERRITIN", "TIBC", "IRON", "RETICCTPCT" in the last 72 hours. Urinalysis    Component Value Date/Time   BILIRUBINUR neg 02/20/2012 1135   PROTEINUR 30 02/20/2012 1135   UROBILINOGEN 0.2 02/20/2012 1135   NITRITE neg 02/20/2012 1135   LEUKOCYTESUR small (1+) 02/20/2012 1135   Sepsis Labs Recent Labs  Lab 11/03/22 1045 11/04/22 0449 11/05/22 0405 11/06/22 0551  WBC 12.8* 11.5* 9.2 9.4   Microbiology Recent Results (from the past 240 hour(s))  Blood culture (routine x 2)     Status: None (Preliminary result)   Collection Time: 11/03/22 11:06 AM   Specimen: BLOOD RIGHT FOREARM  Result Value Ref Range Status   Specimen Description BLOOD RIGHT FOREARM  Final   Special Requests   Final    BOTTLES DRAWN AEROBIC AND ANAEROBIC Blood Culture adequate volume   Culture   Final    NO GROWTH 3 DAYS Performed at [facility], [address], [place],  [place] [address]    Report Status PENDING  Incomplete  MRSA Next Gen by PCR, Nasal     Status: None   Collection Time: 11/04/22  8:25 AM   Specimen: Nasal Mucosa; Nasal Swab  Result Value Ref Range Status   MRSA by PCR Next Gen NOT DETECTED NOT DETECTED Final    Comment: (NOTE) The GeneXpert MRSA Assay (FDA approved for NASAL specimens only), is one component of a comprehensive MRSA colonization surveillance program. It is not intended to diagnose MRSA infection nor to guide or monitor treatment for MRSA infections. Test performance is not FDA approved in patients less than 8 years old. Performed at [facility], [address] [place], [place] [address]     Procedures/Studies: CT ABDOMEN PELVIS W CONTRAST  Result Date: 11/03/2022 CLINICAL DATA:  Hernia suspected.  Pain. EXAM: CT ABDOMEN AND PELVIS WITH CONTRAST TECHNIQUE: Multidetector CT imaging of the abdomen and pelvis was performed using the standard protocol following bolus administration of intravenous contrast. RADIATION  DOSE REDUCTION: This exam was performed according to the departmental dose-optimization program which includes automated exposure control, adjustment of the mA and/or kV according to patient size and/or use of iterative reconstruction technique. CONTRAST:  55mL OMNIPAQUE IOHEXOL 350 MG/ML SOLN COMPARISON:  CT 2015 feb FINDINGS: Lower chest: There is some breathing motion at the lung bases. Slight basilar scar or atelectasis. No pleural effusion. Hepatobiliary: The liver has some tiny low-attenuation lesions, too small to characterize likely small cysts. Example segment 8 measures 8 mm. Tiny focus in segment 7. These were not clearly seen in 2015. Patent portal vein. Gallbladder is nondilated. Pancreas: Mild global fatty infiltration of the pancreas without obvious mass lesion. Spleen: Spleen is nonenlarged. Adrenals/Urinary Tract: Both adrenal glands has some nodular thickening diffusely inner unchanged in appearance  from previous examination. Probable benign hypertrophy. No collecting system dilatation of either kidney. There are some tiny cystic foci in each kidney. Many areas are simply too small to completely characterize. The largest is seen anteriorly on left and measures 13 mm in transverse dimension and has Hounsfield unit of 0, Bosniak 1 cysts. No specific imaging follow-up recommended. Preserved contours of the urinary bladder which is underdistended. Stomach/Bowel: Large bowel has a normal course and caliber with scattered stool. Left-sided colonic diverticula. Normal retrocecal appendix. The stomach and small bowel are nondilated. No free air or free fluid. Vascular/Lymphatic: Normal caliber aorta and IVC. Scattered atherosclerotic plaque. IVC filter in place. The tines of the filter extend along the outside edge of the margins of the IVC in some locations, nonspecific.No developing abnormal lymph node enlargement seen in the abdomen. There are some enlarged nodes however in the pelvis. Example left pelvic sidewall on image 75 of series 3 measures 2.1 x 1.1 cm. Right-sided focus along the external iliac chain measures 2.8 by 1.1 cm. Few small nodes at the aortic bifurcation as well. Reproductive: Prostate is unremarkable. In addition there is significant inflammatory stranding along the right inguinal region extending towards the margin of the scrotum with skin thickening and ill-defined fluid there is also some small pockets of loculated fluid with air such as axial images 109 through 113 this complex area of fluid would measure cephalocaudal length of 3.3 cm and axial plane 3.2 by 1.7 cm. Other: No abdominal wall hernia or abnormality. No abdominopelvic ascites. Musculoskeletal: Scattered degenerative changes along the spine and pelvis. IMPRESSION: Inflammatory stranding, ill-defined areas of fluid and air noted along the right inguinal canal extending down towards the scrotum with skin thickening. Small either  early abscess or phlegmonous process area developing. With a gas recommend close follow up to exclude an aggressive soft tissue infection. There are several mildly enlarged pelvic nodes. Based on the inguinal findings these could be reactive however would recommend follow-up to confirm resolution and provide surveillance. IVC filter. Few colonic diverticula. Critical Value/emergent results were called by telephone at the time of interpretation on 11/03/2022 at 12:35 pm to provider [facility] , who verbally acknowledged these results. Electronically Signed   By: Jill Side M.D.   On: 11/03/2022 12:46     Time coordinating discharge: Over 30 minutes    Dwyane Dee, MD  [facility] 11/06/2022, 2:27 PM

## 2022-11-06 NOTE — Discharge Instructions (Signed)
WOUND CARE: - dressing to be changed daily, and as needed - supplies:1/4 " packing strips, scissors, ABD pads or gauze, tape or clean cotton underwear - remove dressing and all packing carefully - clean edges of skin around the wound with water/gauze, making sure there is no tape debris or leakage left on skin that could cause skin irritation or breakdown. - pack wound from wound base to skin level, making sure to take note of any possible areas of wound tracking, tunneling and packing appropriately. Wound can be packed loosely. Trim packing to size  - cover wound with clean gauze and secure with clean cotton underwear or tape - apply any skin protectant/powder recommended by clinician to protect skin/skin folds. - change dressing as needed if leakage occurs, wound gets contaminated, or patient requests to shower. - patient may shower daily with wound open and following the shower the wound should be dried and a clean dressing placed.

## 2022-11-06 NOTE — Progress Notes (Addendum)
Progress Note     Subjective: NAEO, R groin pain stable to slightly improved. Had a BM this morning. No reported CP or urinary sxs.  AFVSS  Objective: Vital signs in last 24 hours: Temp:  [97.8 F (36.6 C)-98.5 F (36.9 C)] 98.5 F (36.9 C) (01/18 0733) Pulse Rate:  [62-72] 62 (01/18 0733) Resp:  [17-19] 17 (01/18 0733) BP: (125-134)/(71-79) 128/79 (01/18 0733) SpO2:  [92 %-97 %] 92 % (01/18 0733) Last BM Date : 11/05/22  Intake/Output from previous day: 01/17 0701 - 01/18 0700 In: 480 [P.O.:480] Out: -  Intake/Output this shift: No intake/output data recorded.  PE: General: pleasant, WD, obese male who is laying in bed in NAD Heart: regular, rate, and rhythm.   Lungs: Respiratory effort nonlabored Abd: soft, NT, ND, +BS, no masses, hernias, or organomegaly GU: erythema and induration of right groin slightly improved compared to yesterday, packing removed. No bleeding. No residual fluctuance. Re-packed loosely. Psych: A&Ox3 with an appropriate affect.  Lab Results:  Recent Labs    11/05/22 0405 11/06/22 0551  WBC 9.2 9.4  HGB 14.4 14.6  HCT 42.6 44.3  PLT 273 268   BMET Recent Labs    11/05/22 0405 11/06/22 0551  NA 139 139  K 3.5 3.6  CL 106 102  CO2 24 25  GLUCOSE 101* 101*  BUN 20 20  CREATININE 0.84 0.94  CALCIUM 8.8* 9.3   PT/INR Recent Labs    11/05/22 0405 11/06/22 0551  LABPROT 19.8* 17.2*  INR 1.7* 1.4*   CMP     Component Value Date/Time   NA 139 11/06/2022 0551   K 3.6 11/06/2022 0551   CL 102 11/06/2022 0551   CO2 25 11/06/2022 0551   GLUCOSE 101 (H) 11/06/2022 0551   BUN 20 11/06/2022 0551   CREATININE 0.94 11/06/2022 0551   CALCIUM 9.3 11/06/2022 0551   PROT 7.4 11/03/2022 1045   ALBUMIN 3.4 (L) 11/03/2022 1045   AST 25 11/03/2022 1045   ALT 27 11/03/2022 1045   ALKPHOS 37 (L) 11/03/2022 1045   BILITOT 1.2 11/03/2022 1045   GFRNONAA >60 11/06/2022 0551   Lipase  No results found for:  "LIPASE"     Studies/Results: No results found.  Anti-infectives: Anti-infectives (From admission, onward)    Start     Dose/Rate Route Frequency Ordered Stop   11/03/22 1415  piperacillin-tazobactam (ZOSYN) IVPB 3.375 g        3.375 g 12.5 mL/hr over 240 Minutes Intravenous Every 8 hours 11/03/22 1409          Assessment/Plan Small right inguinal abscess - WBC 12 > 11 > 9 , afebrile -s/p bedside I&D 1/17 - Pack inguinal wound daily and as needed for contamination. Wound can be packed loosely with strip of 1/4 inch iodoform - leaving a short tail so it is easy to remove. Cover with 4x4 gauze and secure in pace with underwear.  - wife to come by today around 2:30/3PM for wound care education.  - discussed plan of care with RN claire who will obtain iodoform and perform wound care tteaching with patient and family - ok to resume coumadin - ok for discharge when cleared by medicine. I will arrange follow up in our office in 10-20 days for wound check. Recommend 7 days Augmentin at discharge. I will send pain Rx.  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     Jill Alexanders, [facility] 11/06/2022, 9:32 AM Please see Amion for pager number during day hours 7:00am-4:30pm

## 2022-11-08 LAB — CULTURE, BLOOD (ROUTINE X 2)
Culture: NO GROWTH
Special Requests: ADEQUATE
